# Patient Record
Sex: Male | Born: 1935
Health system: Southern US, Community
[De-identification: ages and names within clinical notes are randomized; demographics above are authoritative.]

## PROBLEM LIST (undated history)

## (undated) DIAGNOSIS — I10 Essential (primary) hypertension: Secondary | ICD-10-CM

## (undated) DIAGNOSIS — I351 Nonrheumatic aortic (valve) insufficiency: Secondary | ICD-10-CM

## (undated) DIAGNOSIS — E785 Hyperlipidemia, unspecified: Secondary | ICD-10-CM

## (undated) DIAGNOSIS — H3320 Serous retinal detachment, unspecified eye: Secondary | ICD-10-CM

## (undated) DIAGNOSIS — C189 Malignant neoplasm of colon, unspecified: Secondary | ICD-10-CM

## (undated) DIAGNOSIS — I219 Acute myocardial infarction, unspecified: Secondary | ICD-10-CM

## (undated) DIAGNOSIS — I517 Cardiomegaly: Secondary | ICD-10-CM

## (undated) HISTORY — PX: EYE SURGERY: SHX253

## (undated) HISTORY — DX: Hyperlipidemia, unspecified: E78.5

## (undated) HISTORY — DX: Acute myocardial infarction, unspecified: I21.9

## (undated) HISTORY — DX: Serous retinal detachment, unspecified eye: H33.20

## (undated) HISTORY — PX: PARTIAL COLECTOMY: SHX5273

## (undated) HISTORY — DX: Malignant neoplasm of colon, unspecified: C18.9

## (undated) HISTORY — DX: Essential (primary) hypertension: I10

---

## 1978-01-16 DIAGNOSIS — I219 Acute myocardial infarction, unspecified: Secondary | ICD-10-CM

## 1978-01-16 HISTORY — DX: Acute myocardial infarction, unspecified: I21.9

## 1988-09-16 DIAGNOSIS — C189 Malignant neoplasm of colon, unspecified: Secondary | ICD-10-CM

## 1988-09-16 HISTORY — DX: Malignant neoplasm of colon, unspecified: C18.9

## 2005-03-21 ENCOUNTER — Ambulatory Visit (HOSPITAL_COMMUNITY): Admission: RE | Admit: 2005-03-21 | Discharge: 2005-03-22 | Payer: Self-pay | Admitting: Ophthalmology

## 2008-10-06 ENCOUNTER — Ambulatory Visit (HOSPITAL_COMMUNITY): Admission: RE | Admit: 2008-10-06 | Discharge: 2008-10-07 | Payer: Self-pay | Admitting: Ophthalmology

## 2008-11-17 ENCOUNTER — Ambulatory Visit (HOSPITAL_COMMUNITY): Admission: RE | Admit: 2008-11-17 | Discharge: 2008-11-18 | Payer: Self-pay | Admitting: Ophthalmology

## 2009-11-18 ENCOUNTER — Observation Stay (HOSPITAL_COMMUNITY): Admission: RE | Admit: 2009-11-18 | Discharge: 2009-11-18 | Payer: Self-pay | Admitting: Ophthalmology

## 2010-02-10 ENCOUNTER — Ambulatory Visit
Admission: RE | Admit: 2010-02-10 | Discharge: 2010-02-10 | Payer: Self-pay | Source: Home / Self Care | Attending: Family Medicine | Admitting: Family Medicine

## 2010-02-10 ENCOUNTER — Encounter: Payer: Self-pay | Admitting: Family Medicine

## 2010-02-10 DIAGNOSIS — C189 Malignant neoplasm of colon, unspecified: Secondary | ICD-10-CM | POA: Insufficient documentation

## 2010-02-10 DIAGNOSIS — D126 Benign neoplasm of colon, unspecified: Secondary | ICD-10-CM | POA: Insufficient documentation

## 2010-02-10 DIAGNOSIS — R3919 Other difficulties with micturition: Secondary | ICD-10-CM | POA: Insufficient documentation

## 2010-02-10 DIAGNOSIS — I10 Essential (primary) hypertension: Secondary | ICD-10-CM | POA: Insufficient documentation

## 2010-02-10 LAB — CONVERTED CEMR LAB
Bilirubin Urine: NEGATIVE
Blood in Urine, dipstick: NEGATIVE
HCT: 45.2 % (ref 39.0–52.0)
Hemoglobin: 15.2 g/dL (ref 13.0–17.0)
Ketones, urine, test strip: NEGATIVE
Lymphs Abs: 1.7 10*3/uL (ref 0.7–4.0)
MCV: 88.6 fL (ref 78.0–100.0)
Monocytes Relative: 5 % (ref 3–12)
Neutrophils Relative %: 62 % (ref 43–77)
Protein, U semiquant: NEGATIVE
RBC: 5.1 M/uL (ref 4.22–5.81)
Urobilinogen, UA: 0.2
WBC: 5.2 10*3/uL (ref 4.0–10.5)

## 2010-02-11 DIAGNOSIS — E785 Hyperlipidemia, unspecified: Secondary | ICD-10-CM | POA: Insufficient documentation

## 2010-02-11 LAB — CONVERTED CEMR LAB
AST: 20 units/L (ref 0–37)
Albumin: 4.2 g/dL (ref 3.5–5.2)
Alkaline Phosphatase: 63 units/L (ref 39–117)
BUN: 14 mg/dL (ref 6–23)
HDL: 78 mg/dL (ref >39)
LDL Cholesterol: 167 mg/dL — ABNORMAL HIGH (ref 0–99)
PSA: 0.55 ng/mL (ref <=4.00)
Potassium: 4.3 meq/L (ref 3.5–5.3)
Total CHOL/HDL Ratio: 3.4

## 2010-02-17 NOTE — Assessment & Plan Note (Signed)
Summary: NOV prostate check   Vital Signs:  Patient profile:   77 year old male Height:      66 inches Weight:      134 pounds BMI:     21.71 O2 Sat:      99 % on Room air Pulse rate:   64 / minute BP sitting:   142 / 76  (left arm) Cuff size:   regular  Vitals Entered By: Payton Spark CMA (February 10, 2010 9:14 AM)  O2 Flow:  Room air CC: New to est.    Primary Care Provider:  Seymour Bars DO  CC:  New to est. .  History of Present Illness: 77 yo [place] male presents for NOV.  He has a distant hx of colon cancer 10-15 yrs ago per his wife's report.  he received a partial colectomy and chemo and had a colonsocpy last done 4 yrs ago in [place] that was normal.  He is likely due for f/u next yr.  It has been a while since he's had his labs checked and has been concerned about prostate cancer b/c recently a friend of his died from this.  He denies any change in his urine other than 'dark color'.  He reports having an AMI 28 yrs ago but never recieved any intervention, never had further problems and has not been taking ANY medications.  Denies problems with CP or DOE.  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 Speaks limited [place], completed elementary school. Has 2 kids. Lives with wife, son and daughter in law. Never smoked. Rare ETOH. Walks for exercise, good diet.  Review of Systems       no fevers/sweats/weakness, unexplained wt loss/gain, no change in vision, no difficulty hearing, ringing in ears, no hay fever/allergies, no  CP/discomfort, no palpitations, no breast lump/nipple discharge, no cough/wheeze, no blood in stool,no  N/V/D, no nocturia, no leaking urine, no unusual vag bleeding, no vaginal/penile discharge, no muscle/joint pain, no rash, no new/changing mole, no HA, no memory loss, no anxiety, no sleep problem, no depression, no unexplained lumps, no easy bruising/bleeding, no concern with sexual function   Physical Exam  General:  alert, well-developed, well-nourished, and well-hydrated.  here with wife (interpreting some of our visit) Head:  normocephalic and atraumatic.   Eyes:  sclera non icteric Ears:  partial cerumen obstruction R side Nose:  no nasal discharge.   Mouth:  pharynx pink and moist.   Neck:  no masses.   Lungs:  normal respiratory effort, no intercostal retractions, no accessory muscle use, normal breath sounds, and no wheezes.   Heart:  normal rate, regular rhythm, and no murmur.   Abdomen:  soft, non-tender, normal bowel sounds, no distention, no masses, no guarding, no hepatomegaly, and no splenomegaly.   Rectal:  hemoccult neg Prostate:  no nodules, no asymmetry, no induration, and 1+ enlarged.   Pulses:  2+ radial pulses Extremities:  no LE edema Skin:  color normal.  1.2 cm dark nevus over R gluteus Cervical Nodes:  No lymphadenopathy noted Psych:  memory intact for recent and  remote, good eye contact, not anxious appearing, and not depressed appearing.     Impression & Recommendations:  Problem # 1:  COLON CANCER (ICD-153.9) Hemoccult neg.  Will get old records.  Likley due to repeat colonoscopy in 1 yr. DRE normal, only mild hypertrophy with a normal UA today. Due for complete screening labs today.  Orders: T-CBC w/Diff ([id number])  Problem # 2:  ESSENTIAL HYPERTENSION, BENIGN (ICD-401.1) Given hx of AMI years ago, will go ahead and treat his HTN with Coreg.  Start ASA 81 mg/ day and update labs.  RTC for a CPE in 6 wks. His updated medication list for this  problem includes:    Carvedilol 6.25 Mg Tabs (Carvedilol) .[name]... 1 tab by mouth two times a day  BP today: 142/76  Complete Medication List: 1)  Aspirin 81 Mg Tbec (Aspirin) .[name].. 1 tab by mouth daily 2)  Carvedilol 6.25 Mg Tabs (Carvedilol) .[name].. 1 tab by mouth two times a day  Other Orders: T-PSA Total (Medicare Screen Only) ([id number]) T-Comprehensive Metabolic Panel ([phone]) T-Lipid Profile (10272-53664) UA Dipstick w/o Micro (automated)  (81003) DRE (Q0347)  Patient Instructions: 1)  UA today. 2)  Prostate exam normal. 3)  Update fasting labs downstairs. 4)  Will call you w/ results tomorrow. 5)  For elevated BP with hx of heart attack:  Start Carvedilol 1 tab in the morning and 1 tab at night everyday.  6)  Add a baby aspirin once daily for heart protection. 7)  Schedule a PHYSICAL in 6 wks. Prescriptions: CARVEDILOL 6.25 MG TABS (CARVEDILOL) 1 tab by mouth two times a day  #60 x 2   Entered and Authorized by:   Seymour Bars DO   Signed by:   Seymour Bars DO on 02/10/2010   Method used:   Electronically to        [facility] [phone]* (retail)       [address] [place], [place]  [address]       Ph: [phone]       Fax: [phone]   RxID:   [id number]    Orders Added: 1)  T-PSA Total (Medicare Screen Only) [id number] 2)  T-CBC w/Diff [28315-17616] 3)  T-Comprehensive Metabolic Panel [80053-22900] 4)  T-Lipid Profile [80061-22930] 5)  UA Dipstick w/o Micro (automated)  [81003] 6)  New Patient Level III [99203] 7)  DRE [G0102]    Laboratory Results   Urine Tests    Routine Urinalysis   Color: yellow Appearance: Clear Glucose: negative   (Normal Range: Negative) Bilirubin: negative   (Normal Range: Negative) Ketone: negative   (Normal Range: Negative) Spec. Gravity: 1.020   (Normal Range: 1.003-1.035) Blood: negative   (Normal Range: Negative) pH: 5.0   (Normal Range: 5.0-8.0) Protein: negative   (Normal Range:  Negative) Urobilinogen: 0.2   (Normal Range: 0-1) Nitrite: negative   (Normal Range: Negative) Leukocyte Esterace: negative   (Normal Range: Negative)

## 2010-03-17 ENCOUNTER — Encounter (INDEPENDENT_AMBULATORY_CARE_PROVIDER_SITE_OTHER): Payer: Medicare Other | Admitting: Family Medicine

## 2010-03-17 ENCOUNTER — Encounter: Payer: Self-pay | Admitting: Family Medicine

## 2010-03-17 DIAGNOSIS — Z23 Encounter for immunization: Secondary | ICD-10-CM

## 2010-03-17 DIAGNOSIS — Z0000 Encounter for general adult medical examination without abnormal findings: Secondary | ICD-10-CM

## 2010-03-24 NOTE — Assessment & Plan Note (Signed)
Summary: CPE   Vital Signs:  Patient profile:   77 year old male Height:      66 inches Weight:      137 pounds BMI:     22.19 O2 Sat:      100 % on Room air Pulse rate:   58 / minute BP sitting:   142 / 76  (left arm) Cuff size:   regular  Vitals Entered By: Payton Spark CMA (March 17, 2010 9:49 AM)  O2 Flow:  Room air CC: CPE    Primary Care Provider:  Seymour Bars DO  CC:  CPE .  History of Present Illness: 77 yo WM presnts for CPE.  He is a fairly healthy [place] male with a hx of colon cancer.  His wife says that his last colonoscopy was about 4 yrs ago.  He is doing well on Carvediolol for HTN and a distant hx of AMI.  He has had some increaesd constipation over hte past 2 wks but denies change in diet or activity level.  Denies blood in his stool, nausea or wt loss.  Denies CP or DOE or palpitations.  REquests to continue prostate cancer screening.  Recently had labs done in Jan and cholesterol was high but he has not yet pick up RX for Simvastatin.  He does also c/o ED.  He is not on nitroglycerin or an alpha  blocker.    Current Medications (verified): 1)  Aspirin 81 Mg Tbec (Aspirin) .[name].. 1 Tab By Mouth Daily 2)  Carvedilol 6.25 Mg Tabs (Carvedilol) .[name].. 1 Tab By Mouth Two Times A Day 3)  Simvastatin 40 Mg Tabs (Simvastatin) .[name].. 1 Tab By Mouth Qhs  Allergies (verified): No Known Drug Allergies  Past History:  Past Medical History: Reviewed history from 02/10/2010 and no changes required. hx colon cancer  ~ 1990s AMI 1980s detached retina- Dr Ashley Royalty  Past Surgical History: Reviewed history from 02/10/2010 and no changes required. partial colectomy eye surgery for detached retina  Family History: Reviewed history from 02/10/2010 and no changes required. father died liver CA mother died 'old age' 2 sisters - 1 died from Alzheimers 2 brothers died - colon cancer/ esophageal cancer  Social History: Reviewed history from 02/10/2010 and no changes  required. 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 Speaks limited [place], completed elementary school. Has 2 kids. Lives with wife, son and daughter in law. Never smoked. Rare ETOH. Walks for exercise, good diet.  Review of Systems  The patient denies anorexia, fever, weight loss, weight gain, vision loss, decreased hearing, hoarseness, chest pain, syncope, dyspnea on exertion, peripheral edema, prolonged cough, headaches, hemoptysis, abdominal pain, melena, hematochezia, severe indigestion/heartburn, hematuria, incontinence, genital sores, muscle weakness, suspicious skin lesions, transient blindness, difficulty walking, depression, unusual weight change, abnormal bleeding, enlarged lymph nodes, angioedema, breast masses, and testicular masses.    Physical Exam  General:  alert, well-developed, well-nourished, and well-hydrated.  here with wife to help interpret Head:  normocephalic and atraumatic.   Eyes:  pupils equal, pupils round, and pupils reactive to light.  arcus seniilis bilat Ears:  EACs patent; TMs translucent and gray with good cone of light and bony landmarks.  Nose:  no nasal discharge.   Mouth:  pharynx pink and moist.   Neck:  no masses.  no audible carotid bruits Lungs:  Normal respiratory effort, chest expands symmetrically. Lungs are clear to auscultation, no crackles or wheezes. Heart:  Normal rate and regular rhythm. S1 and S2 normal without gallop, murmur,  click, rub or other extra sounds. Abdomen:  soft, non-tender, normal bowel sounds, no distention, no guarding, no inguinal hernia, no hepatomegaly, and no splenomegaly.  no AA bruits audible.  LIpoma palpable over the LLQ - nontender. Rectal:  hemoccult neg Prostate:  no nodules and 2+ enlarged.   Msk:  no joint tenderness, no joint swelling, and no joint warmth.   Pulses:  2+ raidal and pedal pulses Extremities:  no LE edema Skin:  color normal.   Cervical Nodes:  No lymphadenopathy noted Psych:  good eye  contact, not anxious appearing, and not depressed appearing.     Impression & Recommendations:  Problem # 1:  Preventive Health Care (ICD-V70.0) Keeping healthy checklist for men reviewed. BP elevated today -- did not take his Carvedilol this AM. Reviewed labs from Jan 2012 and reminded pt to START his Simvastatin for his cholesterol. DRE done, PSA done with labs (plan to stop at 75). Will work on getting him back in with GI for new onset constipation and hx of colon CA -- not sure of last colonoscopy. Tdap and Pneumovax updated today -- per wife had not had either. Healthy diet, regular exercise and MVI /day recommended.  Complete Medication List: 1)  Aspirin 81 Mg Tbec (Aspirin) .[name].. 1 tab by mouth daily 2)  Carvedilol 6.25 Mg Tabs (Carvedilol) .[name].. 1 tab by mouth two times a day 3)  Simvastatin 40 Mg Tabs (Simvastatin) .[name].. 1 tab by mouth qhs  Other Orders: Tdap => 32yrs IM (54098) Admin 1st Vaccine (11914) Pneumococcal Vaccine (78295) Admin of Any Addtl Vaccine (62130)  Patient Instructions: 1)  Continue Carvedilol 2 x a day for high BP. 2)  Start Simvastatin 40 mg at bedtime for high cholesterol. 3)  Add Colace 1-2 x a day (stool softener) for constipation. 4)  Will get you an appt for f/u with GI in [place] 5)  Return for f/u in 3 mos. Prescriptions: SIMVASTATIN 40 MG TABS (SIMVASTATIN) 1 tab by mouth qhs  #30 x 2   Entered and Authorized by:   Seymour Bars DO   Signed by:   Seymour Bars DO on 03/17/2010   Method used:   Electronically to        [facility] [phone]* (retail)       [address] [place], [place]  [address]       Ph: [phone]       Fax: [phone]   RxID:   [id number]    Orders Added: 1)  Tdap => 48yrs IM [90715] 2)  Admin 1st Vaccine [90471] 3)  Pneumococcal Vaccine [56433] 4)  Admin of Any Addtl Vaccine [90472] 5)  Est. Patient age 50&> [29518]   Immunizations Administered:  Tetanus Vaccine:    Vaccine Type: Tdap    Site: left  deltoid    Dose: 0.5 ml    Route: IM    Given by: Payton Spark CMA    Exp. Date: 11/05/2011    Lot #: [id number]    VIS given: 12/04/07 version given March 17, 2010.  Pneumonia Vaccine:    Vaccine Type: Pneumovax (Medicare)    Site: left deltoid    Dose: 0.5 ml    Route: IM    Given by: Payton Spark CMA    Exp. Date: 05/13/2011    Lot #: [id number]    VIS given: 12/21/08 version given March 17, 2010.   Immunizations Administered:  Tetanus Vaccine:  Vaccine Type: Tdap    Site: left deltoid    Dose: 0.5 ml    Route: IM    Given by: Payton Spark CMA    Exp. Date: 11/05/2011    Lot #: [id number]    VIS given: 12/04/07 version given March 17, 2010.  Pneumonia Vaccine:    Vaccine Type: Pneumovax (Medicare)    Site: left deltoid    Dose: 0.5 ml    Route: IM    Given by: Payton Spark CMA    Exp. Date: 05/13/2011    Lot #: [id number]    VIS given: 12/21/08 version given March 17, 2010.

## 2010-03-29 LAB — SURGICAL PCR SCREEN: Staphylococcus aureus: NEGATIVE

## 2010-03-29 LAB — CBC
Platelets: 203 10*3/uL (ref 150–400)
RDW: 12.8 % (ref 11.5–15.5)
WBC: 4.3 10*3/uL (ref 4.0–10.5)

## 2010-03-29 LAB — BASIC METABOLIC PANEL
BUN: 9 mg/dL (ref 6–23)
Calcium: 9.3 mg/dL (ref 8.4–10.5)
Creatinine, Ser: 1.05 mg/dL (ref 0.4–1.5)
GFR calc non Af Amer: >60 mL/min (ref >60)

## 2010-04-20 LAB — BASIC METABOLIC PANEL
BUN: 9 mg/dL (ref 6–23)
CO2: 31 mEq/L (ref 19–32)
Chloride: 103 mEq/L (ref 96–112)
GFR calc non Af Amer: >60 mL/min (ref >60)
Glucose, Bld: 96 mg/dL (ref 70–99)
Potassium: 4.4 mEq/L (ref 3.5–5.1)
Sodium: 138 mEq/L (ref 135–145)

## 2010-04-20 LAB — CBC
HCT: 44 % (ref 39.0–52.0)
Hemoglobin: 15.1 g/dL (ref 13.0–17.0)
MCV: 89.8 fL (ref 78.0–100.0)
RDW: 12.7 % (ref 11.5–15.5)

## 2010-04-22 LAB — BASIC METABOLIC PANEL
BUN: 15 mg/dL (ref 6–23)
Creatinine, Ser: 0.91 mg/dL (ref 0.4–1.5)
GFR calc non Af Amer: >60 mL/min (ref >60)
Glucose, Bld: 88 mg/dL (ref 70–99)
Potassium: 4.4 mEq/L (ref 3.5–5.1)

## 2010-04-22 LAB — CBC
HCT: 46.3 % (ref 39.0–52.0)
MCV: 90.8 fL (ref 78.0–100.0)
Platelets: 239 10*3/uL (ref 150–400)
RDW: 12.7 % (ref 11.5–15.5)

## 2010-04-22 LAB — AUTOLOGOUS SERUM PATCH PREP

## 2010-04-28 ENCOUNTER — Encounter: Payer: Self-pay | Admitting: Family Medicine

## 2010-05-10 ENCOUNTER — Encounter: Payer: Self-pay | Admitting: Family Medicine

## 2010-06-03 NOTE — Op Note (Signed)
Gregory Cuevas, DELAUGHTER                     ACCOUNT NO.:  [id number]   MEDICAL RECORD NO.:  [id number]          PATIENT TYPE:  AMB   LOCATION:  SDS                          FACILITY:  [facility]   PHYSICIAN:  John D. Ashley Royalty, M.D. DATE OF BIRTH:  [birth year]   DATE OF PROCEDURE:  03/21/2005  DATE OF DISCHARGE:                                 OPERATIVE REPORT   ADMISSION DIAGNOSIS:  Macular hole, left eye.   PROCEDURES:  1.  Pars plana vitrectomy, left eye.  2.  Retinal photocoagulation, left eye.  3.  Membrane peel, left eye.  4.  Serum patch, left eye.  5.  Gas-fluid exchange, left eye.  6.  Perfluoropropane injection, left eye.   SURGEON:  Beulah Gandy. Ashley Royalty, M.D.   ASSISTANT:  Rosalie Doctor, MA   ANESTHESIA:  General.   DETAILS:  Usual prep and drape, peritomies at 10, 2 and 4 o'clock.  The 5-mm  infusion port anchored into place at 4 o'clock.  Contact lens ring anchored  into place at 6 and 12 o'clock.  Provisc placed on the corneal surface and  the flat contact lens was placed.  Pars plana vitrectomy was begun just  behind the pseudophakos.  Blood and vitreous were encountered well as  vitreous pigment.  This material was removed under low suction and rapid  cutting down to the macular surface.  The vitrectomy was carried out into  the periphery and loose vitreous was carefully removed.  The silicone-tipped  suction line was drawn down to the macular surface until the fish strike  sign occurred.  Posterior membrane peeling was performed at this time with  the lighted pick and the suction line.  The 30-degree prismatic lens was  moved into place and the vitrectomy was carried down to the vitreous base  for 360 degrees.  Once this was accomplished, the magnifying contact lens  was placed and attention was carried to the macular hole region.  The  diamond-dusted membrane scraper was used to engage the internal limiting  membrane and peel it per 360 degrees around the hole.  The entire  macular  region was peeled.  The membrane remnants were removed from the eye.  A gas-  fluid exchange was then carried out.  Before the gas-fluid exchange, the  indirect ophthalmoscope laser was moved into place, 508 burns were placed  around the retinal periphery with a power of 400 milliwatts, 1000 microns  each and 0.07 second each.  An air-fluid exchange was carried out.  Sufficient time was allowed for additional fluid to track down the walls of  the eye and collect in the posterior segment.  A serum patch and Perfluoron  propane 17% were prepared during this time.  Additional fluid was removed  with the [employer] [facility] brush  and serum patch was delivered.  The  intravitreal gas was exchanged for C3F8 17%.  The instruments were removed  from the eye and 9-0 nylon was used to close the sclerotomy sites.  They  were tested and found to be tight.  Conjunctiva was closed  with wet-field  cautery.  The closing tension was 10 with a Barraquer tonometer.  Polymyxin  and gentamicin were  irrigated into Tenon's space, Marcaine was injected around the globe for  postop pain and Decadron 10 mg was injected to the lower subconjunctival  space.  TobraDex ophthalmic ointment, a patch and shield were placed.  The  patient was awakened and taken to recovery in satisfactory condition.      Beulah Gandy. Ashley Royalty, M.D.  Electronically Signed     JDM/MEDQ  D:  03/21/2005  T:  03/22/2005  Job:  [id number]

## 2010-06-21 ENCOUNTER — Ambulatory Visit: Payer: Medicare Other | Admitting: Family Medicine

## 2010-08-23 ENCOUNTER — Encounter (INDEPENDENT_AMBULATORY_CARE_PROVIDER_SITE_OTHER): Payer: Medicare Other | Admitting: Ophthalmology

## 2011-04-25 ENCOUNTER — Ambulatory Visit (INDEPENDENT_AMBULATORY_CARE_PROVIDER_SITE_OTHER): Payer: Medicare Other | Admitting: Family Medicine

## 2011-04-25 ENCOUNTER — Encounter: Payer: Self-pay | Admitting: Family Medicine

## 2011-04-25 VITALS — BP 161/80 | HR 61 | Ht 66.0 in | Wt 137.0 lb

## 2011-04-25 DIAGNOSIS — M791 Myalgia, unspecified site: Secondary | ICD-10-CM

## 2011-04-25 DIAGNOSIS — IMO0001 Reserved for inherently not codable concepts without codable children: Secondary | ICD-10-CM

## 2011-04-25 DIAGNOSIS — Z1331 Encounter for screening for depression: Secondary | ICD-10-CM

## 2011-04-25 DIAGNOSIS — Z9181 History of falling: Secondary | ICD-10-CM

## 2011-04-25 DIAGNOSIS — I10 Essential (primary) hypertension: Secondary | ICD-10-CM

## 2011-04-25 LAB — CBC
HCT: 45.1 % (ref 39.0–52.0)
Hemoglobin: 14.4 g/dL (ref 13.0–17.0)
MCH: 29.1 pg (ref 26.0–34.0)
MCV: 91.1 fL (ref 78.0–100.0)
RBC: 4.95 MIL/uL (ref 4.22–5.81)

## 2011-04-25 LAB — LIPID PANEL
Cholesterol: 263 mg/dL — ABNORMAL HIGH (ref 0–200)
HDL: 77 mg/dL (ref >39)
Total CHOL/HDL Ratio: 3.4 Ratio

## 2011-04-25 MED ORDER — METOPROLOL TARTRATE 50 MG PO TABS: 50.0000 mg | ORAL_TABLET | Freq: Two times a day (BID) | ORAL | Status: DC

## 2011-04-25 MED ORDER — PRAVASTATIN SODIUM 40 MG PO TABS: 40.0000 mg | ORAL_TABLET | Freq: Every day | ORAL | Status: DC

## 2011-04-25 NOTE — Progress Notes (Addendum)
  Subjective:    Patient ID: Gregory Cuevas, male    DOB: January 21, 1933, 78 y.o.   MRN: [id number]  HPI  Feels a pinching pain in the lower arms and on the sides of hte knees. Mostly happens at night.  Says better with acitivyt. Worse at rest. No meds for pain.  Started a couple of months ago.  No weaknesss  BP - Says his BP is high in the AM but better in the afternoons. Hx of CAD in the 53s.    Review of Systems     Objective:   Physical Exam  Constitutional: He is oriented to person, place, and time. He appears well-developed and well-nourished.  HENT:  Head: Normocephalic and atraumatic.       Left TM is clear.  Right canal is blocked by cerumen.   Cardiovascular: Normal rate, regular rhythm and normal heart sounds.        No carotid or abdominal bruits.   Pulmonary/Chest: Effort normal and breath sounds normal.  Musculoskeletal:       Joints have normal range of motion. He is nontender over the muscles themselves.  Neurological: He is alert and oriented to person, place, and time.  Skin: Skin is warm and dry.  Psychiatric: He has a normal mood and affect. His behavior is normal.          Assessment & Plan:  Pinching pain-  Unclear etiology. I'm not sure if this is more like myalgias. We'll check a CK, CBC, and a TSH.  HTN - new diagnosis. I discussed that his blood pressure is high again has been high the last time he's been here. He feels that an afternoon. I did explain to him with his history of coronary artery disease but he really need to be on a beta blocker and statin in addition to his aspirin he does take daily currently. I like him to follow up in 6 weeks to recheck his blood pressure make sure he is on target with his medications. We will check a CMP and a lipid panel today.  Coronary artery disease-see above under hypertension. He is taking a daily aspirin currently.  History of colon cancer-his colonoscopy is up to date and he had one last year.  Depression screen-PHQ  9 score of 1. Negative for depression.  fall assessment-score of 2, low risk for falls.

## 2011-04-26 ENCOUNTER — Encounter: Payer: Self-pay | Admitting: *Deleted

## 2011-04-26 LAB — COMPLETE METABOLIC PANEL WITH GFR
AST: 18 U/L (ref 0–37)
Alkaline Phosphatase: 66 U/L (ref 39–117)
BUN: 23 mg/dL (ref 6–23)
GFR, Est Non African American: 84 mL/min
Glucose, Bld: 91 mg/dL (ref 70–99)
Sodium: 139 mEq/L (ref 135–145)
Total Bilirubin: 0.7 mg/dL (ref 0.3–1.2)

## 2011-04-27 ENCOUNTER — Encounter: Payer: Self-pay | Admitting: *Deleted

## 2011-06-02 ENCOUNTER — Encounter: Payer: Self-pay | Admitting: *Deleted

## 2011-06-06 ENCOUNTER — Encounter: Payer: Medicare Other | Admitting: Family Medicine

## 2011-08-02 IMAGING — CR DG CHEST 2V
2 series · 2 of 2 positions shown · non-contrast
Comparison: 03/21/2005

CLINICAL DATA: Preop

CHEST - 2 VIEW

[view not recorded (1 of 2)]
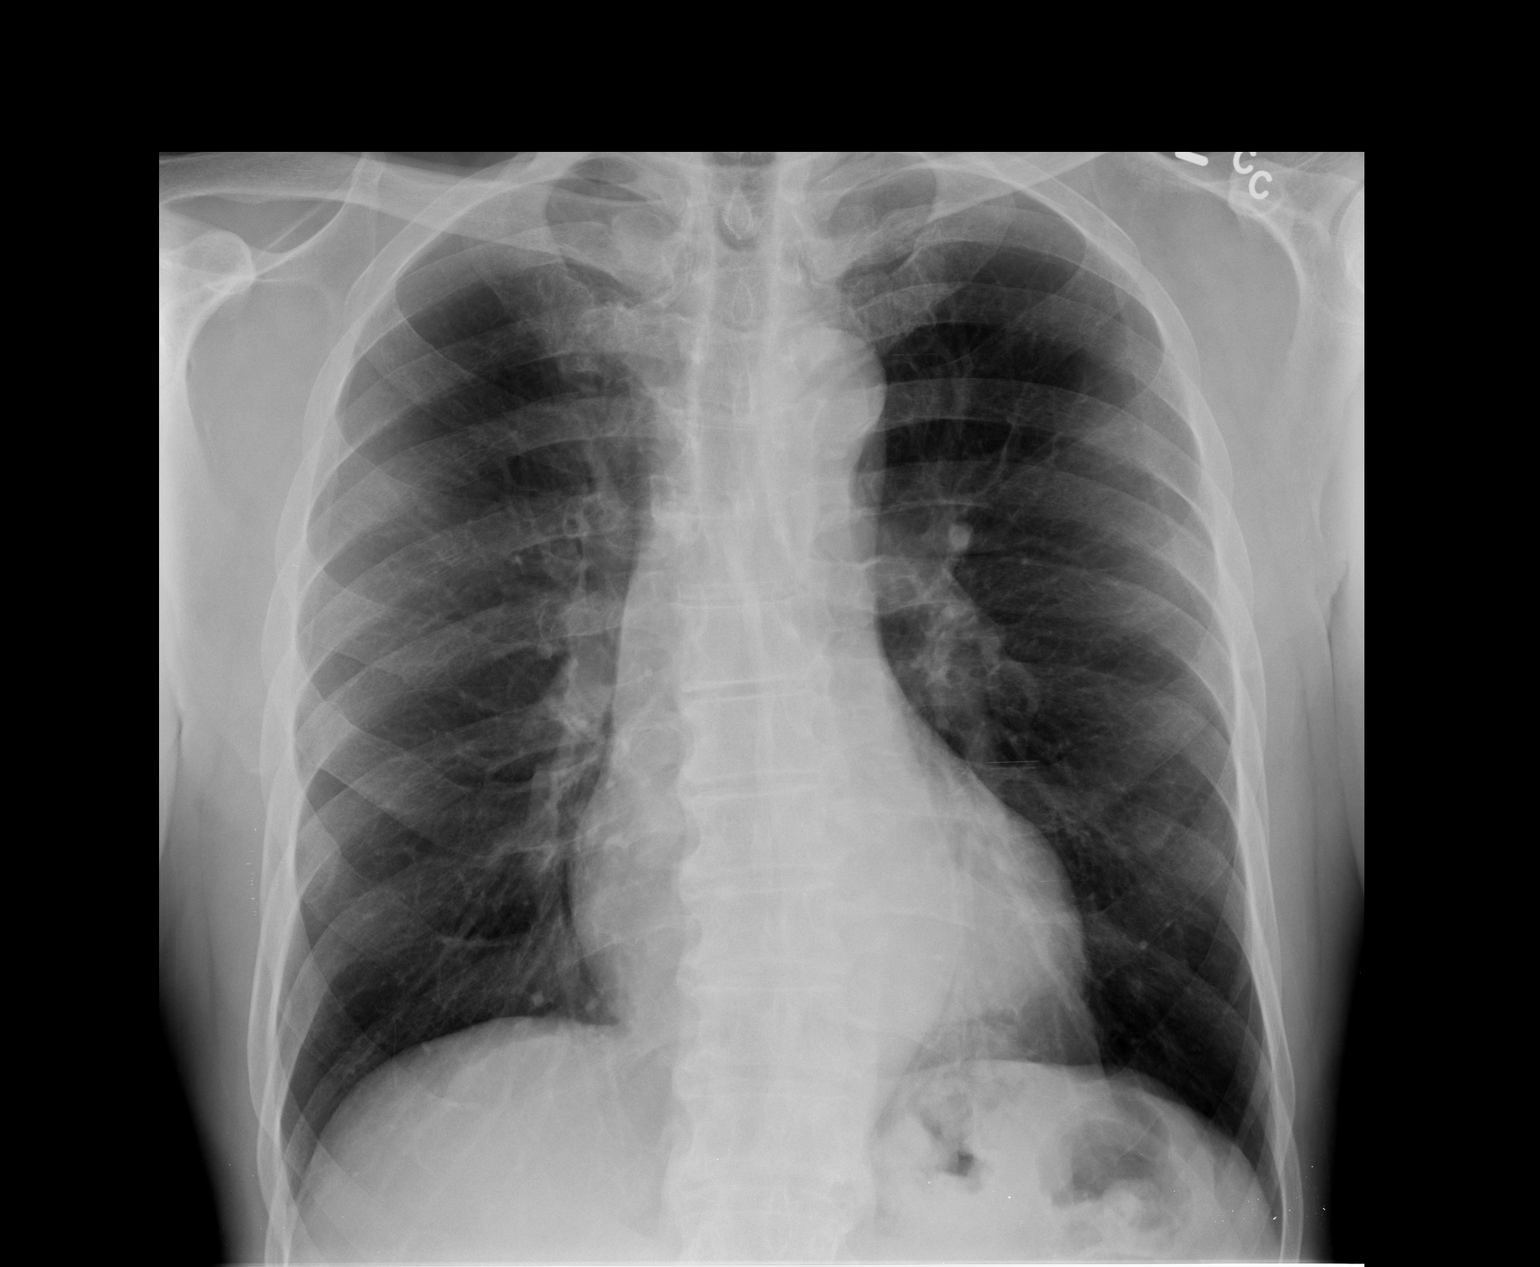

[view not recorded (2 of 2)]
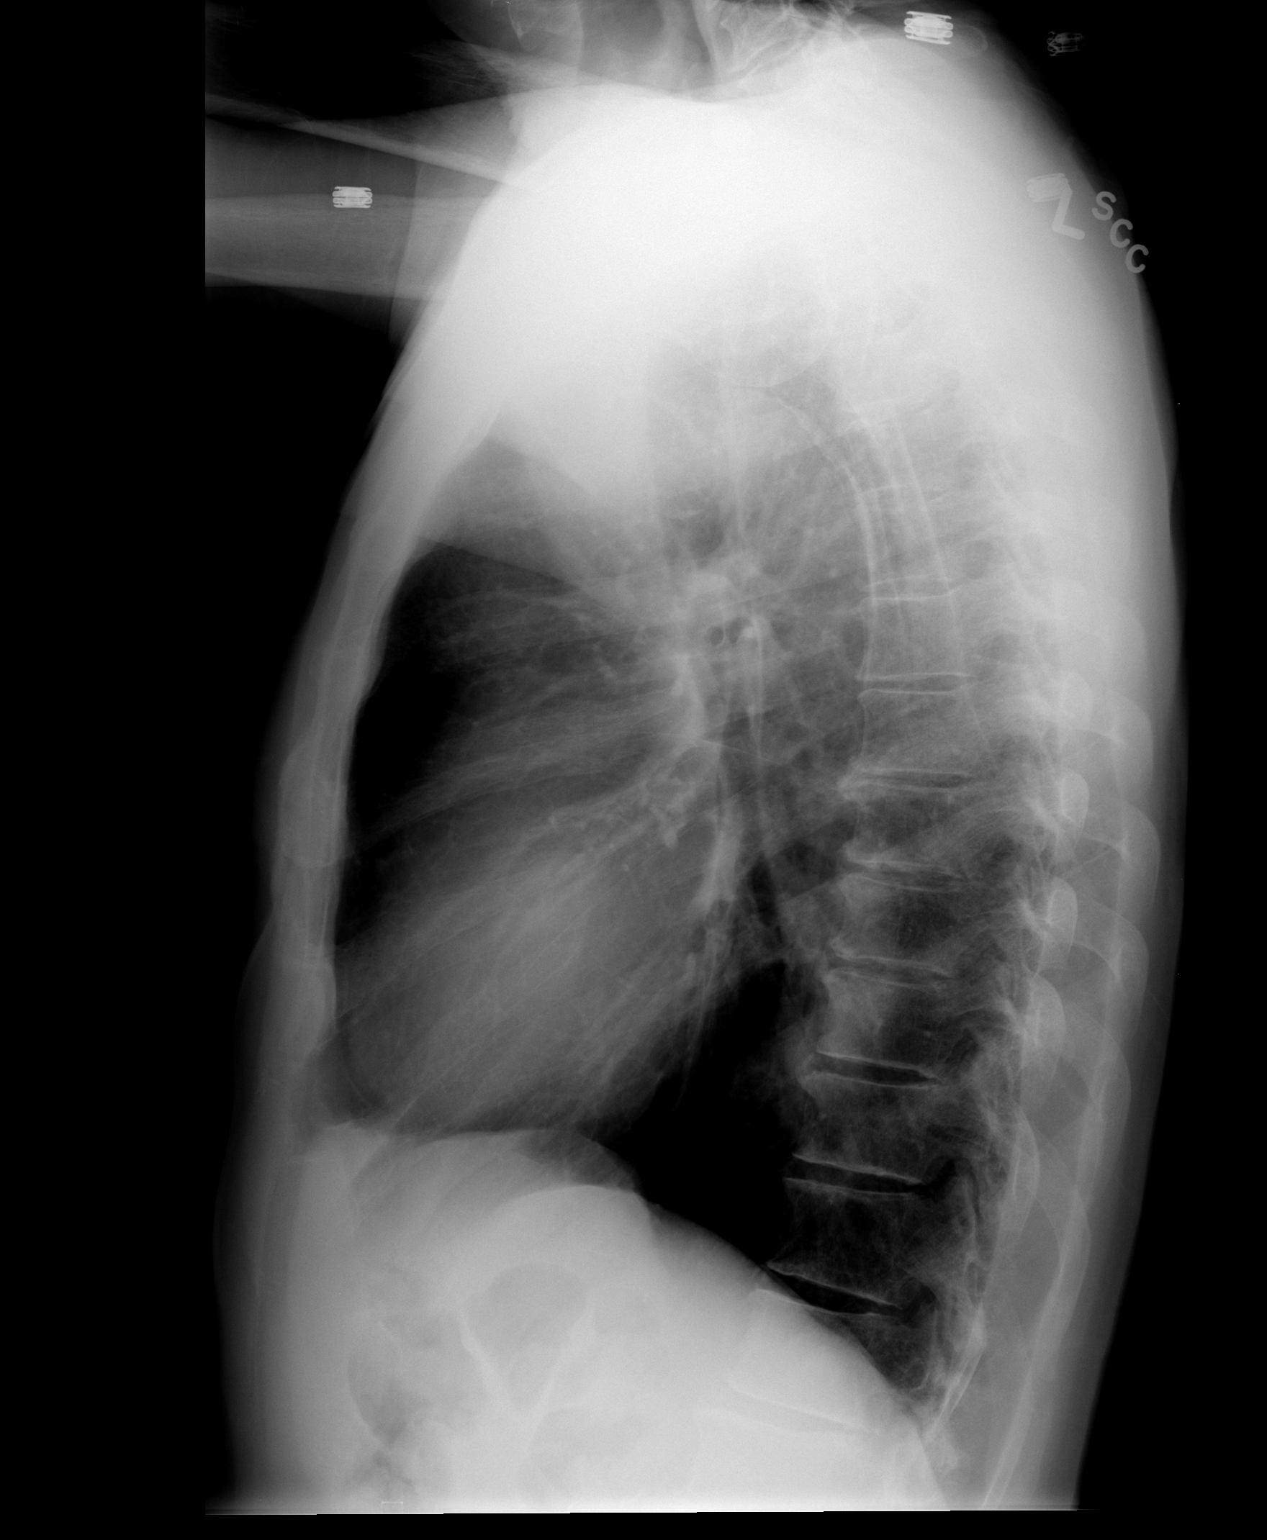

[2 of 2 positions shown; findings below may reference images not displayed]

FINDINGS: Cardiomediastinal silhouette is stable.  No acute
infiltrate or pleural effusion.  No pulmonary edema.  Mild
hyperinflation again noted.  Stable mild degenerative changes
thoracic spine.
IMPRESSION: Stable mild COPD.  No acute disease.

## 2012-09-17 ENCOUNTER — Other Ambulatory Visit: Payer: Self-pay | Admitting: Family Medicine

## 2012-10-09 ENCOUNTER — Telehealth: Payer: Self-pay | Admitting: Physician Assistant

## 2012-10-09 NOTE — Telephone Encounter (Signed)
Insurance sent recommendations of vaccines for shingles,tdap and flu. You have not been seen in a while. It would be great to come in and make an appt to go over any health issues and make sure screening test are up to date.

## 2012-11-08 ENCOUNTER — Telehealth: Payer: Self-pay | Admitting: Physician Assistant

## 2012-11-08 NOTE — Telephone Encounter (Signed)
Consider appt with Dr. Linford Arnold to discuss why not taking medications. Engineer, mining.

## 2012-11-14 NOTE — Telephone Encounter (Signed)
Mailbox full . Unable to leave message

## 2012-12-25 ENCOUNTER — Ambulatory Visit (INDEPENDENT_AMBULATORY_CARE_PROVIDER_SITE_OTHER): Payer: Medicare Other | Admitting: Physician Assistant

## 2012-12-25 ENCOUNTER — Encounter: Payer: Medicare Other | Admitting: Family Medicine

## 2012-12-25 ENCOUNTER — Encounter: Payer: Self-pay | Admitting: Physician Assistant

## 2012-12-25 VITALS — BP 161/64 | HR 60 | Wt 133.0 lb

## 2012-12-25 DIAGNOSIS — R109 Unspecified abdominal pain: Secondary | ICD-10-CM

## 2012-12-25 DIAGNOSIS — E785 Hyperlipidemia, unspecified: Secondary | ICD-10-CM

## 2012-12-25 DIAGNOSIS — S335XXA Sprain of ligaments of lumbar spine, initial encounter: Secondary | ICD-10-CM

## 2012-12-25 DIAGNOSIS — Z0000 Encounter for general adult medical examination without abnormal findings: Secondary | ICD-10-CM

## 2012-12-25 DIAGNOSIS — Z23 Encounter for immunization: Secondary | ICD-10-CM

## 2012-12-25 DIAGNOSIS — I10 Essential (primary) hypertension: Secondary | ICD-10-CM

## 2012-12-25 LAB — POCT URINALYSIS DIPSTICK
Bilirubin, UA: NEGATIVE
Ketones, UA: NEGATIVE
Leukocytes, UA: NEGATIVE
pH, UA: 7

## 2012-12-25 MED ORDER — METOPROLOL TARTRATE 50 MG PO TABS: 50.0000 mg | ORAL_TABLET | Freq: Two times a day (BID) | ORAL | Status: DC

## 2012-12-25 MED ORDER — PRAVASTATIN SODIUM 40 MG PO TABS: 40.0000 mg | ORAL_TABLET | Freq: Every day | ORAL | Status: DC

## 2012-12-25 MED ORDER — AMBULATORY NON FORMULARY MEDICATION: Status: DC

## 2012-12-25 NOTE — Patient Instructions (Addendum)
Muscle sprain- alternate Ice and Heat can also use ibuprofen up to 800mg  up to three times a day. I will give exercises that can do.   Low Back Sprain with Rehab  A sprain is an injury in which a ligament is torn. The ligaments of the lower back are vulnerable to sprains. However, they are strong and require great force to be injured. These ligaments are important for stabilizing the spinal column. Sprains are classified into three categories. Grade 1 sprains cause pain, but the tendon is not lengthened. Grade 2 sprains include a lengthened ligament, due to the ligament being stretched or partially ruptured. With grade 2 sprains there is still function, although the function may be decreased. Grade 3 sprains involve a complete tear of the tendon or muscle, and function is usually impaired. SYMPTOMS   Severe pain in the lower back.  Sometimes, a feeling of a "pop," "snap," or tear, at the time of injury.  Tenderness and sometimes swelling at the injury site.  Uncommonly, bruising (contusion) within 48 hours of injury.  Muscle spasms in the back. CAUSES  Low back sprains occur when a force is placed on the ligaments that is greater than they can handle. Common causes of injury include:  Performing a stressful act while off-balance.  Repetitive stressful activities that involve movement of the lower back.  Direct hit (trauma) to the lower back. RISK INCREASES WITH:  Contact sports (football, wrestling).  Collisions (major skiing accidents).  Sports that require throwing or lifting (baseball, weightlifting).  Sports involving twisting of the spine (gymnastics, diving, tennis, golf).  Poor strength and flexibility.  Inadequate protection.  Previous back injury or surgery (especially fusion). PREVENTION  Wear properly fitted and padded protective equipment.  Warm up and stretch properly before activity.  Allow for adequate recovery between workouts.  Maintain physical  fitness:  Strength, flexibility, and endurance.  Cardiovascular fitness.  Maintain a healthy body weight. PROGNOSIS  If treated properly, low back sprains usually heal with non-surgical treatment. The length of time for healing depends on the severity of the injury.  RELATED COMPLICATIONS   Recurring symptoms, resulting in a chronic problem.  Chronic inflammation and pain in the low back.  Delayed healing or resolution of symptoms, especially if activity is resumed too soon.  Prolonged impairment.  Unstable or arthritic joints of the low back. TREATMENT  Treatment first involves the use of ice and medicine, to reduce pain and inflammation. The use of strengthening and stretching exercises may help reduce pain with activity. These exercises may be performed at home or with a therapist. Severe injuries may require referral to a therapist for further evaluation and treatment, such as ultrasound. Your caregiver may advise that you wear a back brace or corset, to help reduce pain and discomfort. Often, prolonged bed rest results in greater harm then benefit. Corticosteroid injections may be recommended. However, these should be reserved for the most serious cases. It is important to avoid using your back when lifting objects. At night, sleep on your back on a firm mattress, with a pillow placed under your knees. If non-surgical treatment is unsuccessful, surgery may be needed.  MEDICATION   If pain medicine is needed, nonsteroidal anti-inflammatory medicines (aspirin and ibuprofen), or other minor pain relievers (acetaminophen), are often advised.  Do not take pain medicine for 7 days before surgery.  Prescription pain relievers may be given, if your caregiver thinks they are needed. Use only as directed and only as much as you need.  Ointments applied to the skin may be helpful.  Corticosteroid injections may be given by your caregiver. These injections should be reserved for the most  serious cases, because they may only be given a certain number of times. HEAT AND COLD  Cold treatment (icing) should be applied for 10 to 15 minutes every 2 to 3 hours for inflammation and pain, and immediately after activity that aggravates your symptoms. Use ice packs or an ice massage.  Heat treatment may be used before performing stretching and strengthening activities prescribed by your caregiver, physical therapist, or athletic trainer. Use a heat pack or a warm water soak. SEEK MEDICAL CARE IF:   Symptoms get worse or do not improve in 2 to 4 weeks, despite treatment.  You develop numbness or weakness in either leg.  You lose bowel or bladder function.  Any of the following occur after surgery: fever, increased pain, swelling, redness, drainage of fluids, or bleeding in the affected area.  New, unexplained symptoms develop. (Drugs used in treatment may produce side effects.) EXERCISES  RANGE OF MOTION (ROM) AND STRETCHING EXERCISES - Low Back Sprain Most people with lower back pain will find that their symptoms get worse with excessive bending forward (flexion) or arching at the lower back (extension). The exercises that will help resolve your symptoms will focus on the opposite motion.  Your physician, physical therapist or athletic trainer will help you determine which exercises will be most helpful to resolve your lower back pain. Do not complete any exercises without first consulting with your caregiver. Discontinue any exercises which make your symptoms worse, until you speak to your caregiver. If you have pain, numbness or tingling which travels down into your buttocks, leg or foot, the goal of the therapy is for these symptoms to move closer to your back and eventually resolve. Sometimes, these leg symptoms will get better, but your lower back pain may worsen. This is often an indication of progress in your rehabilitation. Be very alert to any changes in your symptoms and the  activities in which you participated in the 24 hours prior to the change. Sharing this information with your caregiver will allow him or her to most efficiently treat your condition. These exercises may help you when beginning to rehabilitate your injury. Your symptoms may resolve with or without further involvement from your physician, physical therapist or athletic trainer. While completing these exercises, remember:   Restoring tissue flexibility helps normal motion to return to the joints. This allows healthier, less painful movement and activity.  An effective stretch should be held for at least 30 seconds.  A stretch should never be painful. You should only feel a gentle lengthening or release in the stretched tissue. FLEXION RANGE OF MOTION AND STRETCHING EXERCISES: STRETCH  Flexion, Single Knee to Chest   Lie on a firm bed or floor with both legs extended in front of you.  Keeping one leg in contact with the floor, bring your opposite knee to your chest. Hold your leg in place by either grabbing behind your thigh or at your knee.  Pull until you feel a gentle stretch in your low back. Hold __________ seconds.  Slowly release your grasp and repeat the exercise with the opposite side. Repeat __________ times. Complete this exercise __________ times per day.  STRETCH  Flexion, Double Knee to Chest  Lie on a firm bed or floor with both legs extended in front of you.  Keeping one leg in contact with the floor, bring  your opposite knee to your chest.  Tense your stomach muscles to support your back and then lift your other knee to your chest. Hold your legs in place by either grabbing behind your thighs or at your knees.  Pull both knees toward your chest until you feel a gentle stretch in your low back. Hold __________ seconds.  Tense your stomach muscles and slowly return one leg at a time to the floor. Repeat __________ times. Complete this exercise __________ times per day.   STRETCH  Low Trunk Rotation  Lie on a firm bed or floor. Keeping your legs in front of you, bend your knees so they are both pointed toward the ceiling and your feet are flat on the floor.  Extend your arms out to the side. This will stabilize your upper body by keeping your shoulders in contact with the floor.  Gently and slowly drop both knees together to one side until you feel a gentle stretch in your low back. Hold for __________ seconds.  Tense your stomach muscles to support your lower back as you bring your knees back to the starting position. Repeat the exercise to the other side. Repeat __________ times. Complete this exercise __________ times per day  EXTENSION RANGE OF MOTION AND FLEXIBILITY EXERCISES: STRETCH  Extension, Prone on Elbows   Lie on your stomach on the floor, a bed will be too soft. Place your palms about shoulder width apart and at the height of your head.  Place your elbows under your shoulders. If this is too painful, stack pillows under your chest.  Allow your body to relax so that your hips drop lower and make contact more completely with the floor.  Hold this position for __________ seconds.  Slowly return to lying flat on the floor. Repeat __________ times. Complete this exercise __________ times per day.  RANGE OF MOTION  Extension, Prone Press Ups  Lie on your stomach on the floor, a bed will be too soft. Place your palms about shoulder width apart and at the height of your head.  Keeping your back as relaxed as possible, slowly straighten your elbows while keeping your hips on the floor. You may adjust the placement of your hands to maximize your comfort. As you gain motion, your hands will come more underneath your shoulders.  Hold this position __________ seconds.  Slowly return to lying flat on the floor. Repeat __________ times. Complete this exercise __________ times per day.  RANGE OF MOTION- Quadruped, Neutral Spine   Assume a hands and  knees position on a firm surface. Keep your hands under your shoulders and your knees under your hips. You may place padding under your knees for comfort.  Drop your head and point your tailbone toward the ground below you. This will round out your lower back like an angry cat. Hold this position for __________ seconds.  Slowly lift your head and release your tail bone so that your back sags into a large arch, like an old horse.  Hold this position for __________ seconds.  Repeat this until you feel limber in your low back.  Now, find your "sweet spot." This will be the most comfortable position somewhere between the two previous positions. This is your neutral spine. Once you have found this position, tense your stomach muscles to support your low back.  Hold this position for __________ seconds. Repeat __________ times. Complete this exercise __________ times per day.  STRENGTHENING EXERCISES - Low Back Sprain These exercises may  help you when beginning to rehabilitate your injury. These exercises should be done near your "sweet spot." This is the neutral, low-back arch, somewhere between fully rounded and fully arched, that is your least painful position. When performed in this safe range of motion, these exercises can be used for people who have either a flexion or extension based injury. These exercises may resolve your symptoms with or without further involvement from your physician, physical therapist or athletic trainer. While completing these exercises, remember:   Muscles can gain both the endurance and the strength needed for everyday activities through controlled exercises.  Complete these exercises as instructed by your physician, physical therapist or athletic trainer. Increase the resistance and repetitions only as guided.  You may experience muscle soreness or fatigue, but the pain or discomfort you are trying to eliminate should never worsen during these exercises. If this pain  does worsen, stop and make certain you are following the directions exactly. If the pain is still present after adjustments, discontinue the exercise until you can discuss the trouble with your caregiver. STRENGTHENING Deep Abdominals, Pelvic Tilt   Lie on a firm bed or floor. Keeping your legs in front of you, bend your knees so they are both pointed toward the ceiling and your feet are flat on the floor.  Tense your lower abdominal muscles to press your low back into the floor. This motion will rotate your pelvis so that your tail bone is scooping upwards rather than pointing at your feet or into the floor. With a gentle tension and even breathing, hold this position for __________ seconds. Repeat __________ times. Complete this exercise __________ times per day.  STRENGTHENING  Abdominals, Crunches   Lie on a firm bed or floor. Keeping your legs in front of you, bend your knees so they are both pointed toward the ceiling and your feet are flat on the floor. Cross your arms over your chest.  Slightly tip your chin down without bending your neck.  Tense your abdominals and slowly lift your trunk high enough to just clear your shoulder blades. Lifting higher can put excessive stress on the lower back and does not further strengthen your abdominal muscles.  Control your return to the starting position. Repeat __________ times. Complete this exercise __________ times per day.  STRENGTHENING  Quadruped, Opposite UE/LE Lift   Assume a hands and knees position on a firm surface. Keep your hands under your shoulders and your knees under your hips. You may place padding under your knees for comfort.  Find your neutral spine and gently tense your abdominal muscles so that you can maintain this position. Your shoulders and hips should form a rectangle that is parallel with the floor and is not twisted.  Keeping your trunk steady, lift your right hand no higher than your shoulder and then your left leg  no higher than your hip. Make sure you are not holding your breath. Hold this position for __________ seconds.  Continuing to keep your abdominal muscles tense and your back steady, slowly return to your starting position. Repeat with the opposite arm and leg. Repeat __________ times. Complete this exercise __________ times per day.  STRENGTHENING  Abdominals and Quadriceps, Straight Leg Raise   Lie on a firm bed or floor with both legs extended in front of you.  Keeping one leg in contact with the floor, bend the other knee so that your foot can rest flat on the floor.  Find your neutral spine, and tense  your abdominal muscles to maintain your spinal position throughout the exercise.  Slowly lift your straight leg off the floor about 6 inches for a count of 15, making sure to not hold your breath.  Still keeping your neutral spine, slowly lower your leg all the way to the floor. Repeat this exercise with each leg __________ times. Complete this exercise __________ times per day. POSTURE AND BODY MECHANICS CONSIDERATIONS - Low Back Sprain Keeping correct posture when sitting, standing or completing your activities will reduce the stress put on different body tissues, allowing injured tissues a chance to heal and limiting painful experiences. The following are general guidelines for improved posture. Your physician or physical therapist will provide you with any instructions specific to your needs. While reading these guidelines, remember:  The exercises prescribed by your provider will help you have the flexibility and strength to maintain correct postures.  The correct posture provides the best environment for your joints to work. All of your joints have less wear and tear when properly supported by a spine with good posture. This means you will experience a healthier, less painful body.  Correct posture must be practiced with all of your activities, especially prolonged sitting and standing.  Correct posture is as important when doing repetitive low-stress activities (typing) as it is when doing a single heavy-load activity (lifting). RESTING POSITIONS Consider which positions are most painful for you when choosing a resting position. If you have pain with flexion-based activities (sitting, bending, stooping, squatting), choose a position that allows you to rest in a less flexed posture. You would want to avoid curling into a fetal position on your side. If your pain worsens with extension-based activities (prolonged standing, working overhead), avoid resting in an extended position such as sleeping on your stomach. Most people will find more comfort when they rest with their spine in a more neutral position, neither too rounded nor too arched. Lying on a non-sagging bed on your side with a pillow between your knees, or on your back with a pillow under your knees will often provide some relief. Keep in mind, being in any one position for a prolonged period of time, no matter how correct your posture, can still lead to stiffness. PROPER SITTING POSTURE In order to minimize stress and discomfort on your spine, you must sit with correct posture. Sitting with good posture should be effortless for a healthy body. Returning to good posture is a gradual process. Many people can work toward this most comfortably by using various supports until they have the flexibility and strength to maintain this posture on their own. When sitting with proper posture, your ears will fall over your shoulders and your shoulders will fall over your hips. You should use the back of the chair to support your upper back. Your lower back will be in a neutral position, just slightly arched. You may place a small pillow or folded towel at the base of your lower back for  support.  When working at a desk, create an environment that supports good, upright posture. Without extra support, muscles tire, which leads to excessive  strain on joints and other tissues. Keep these recommendations in mind: CHAIR:  A chair should be able to slide under your desk when your back makes contact with the back of the chair. This allows you to work closely.  The chair's height should allow your eyes to be level with the upper part of your monitor and your hands to be slightly lower  than your elbows. BODY POSITION  Your feet should make contact with the floor. If this is not possible, use a foot rest.  Keep your ears over your shoulders. This will reduce stress on your neck and low back. INCORRECT SITTING POSTURES  If you are feeling tired and unable to assume a healthy sitting posture, do not slouch or slump. This puts excessive strain on your back tissues, causing more damage and pain. Healthier options include:  Using more support, like a lumbar pillow.  Switching tasks to something that requires you to be upright or walking.  Talking a brief walk.  Lying down to rest in a neutral-spine position. PROLONGED STANDING WHILE SLIGHTLY LEANING FORWARD  When completing a task that requires you to lean forward while standing in one place for a long time, place either foot up on a stationary 2-4 inch high object to help maintain the best posture. When both feet are on the ground, the lower back tends to lose its slight inward curve. If this curve flattens (or becomes too large), then the back and your other joints will experience too much stress, tire more quickly, and can cause pain. CORRECT STANDING POSTURES Proper standing posture should be assumed with all daily activities, even if they only take a few moments, like when brushing your teeth. As in sitting, your ears should fall over your shoulders and your shoulders should fall over your hips. You should keep a slight tension in your abdominal muscles to brace your spine. Your tailbone should point down to the ground, not behind your body, resulting in an over-extended swayback  posture.  INCORRECT STANDING POSTURES  Common incorrect standing postures include a forward head, locked knees and/or an excessive swayback. WALKING Walk with an upright posture. Your ears, shoulders and hips should all line-up. PROLONGED ACTIVITY IN A FLEXED POSITION When completing a task that requires you to bend forward at your waist or lean over a low surface, try to find a way to stabilize 3 out of 4 of your limbs. You can place a hand or elbow on your thigh or rest a knee on the surface you are reaching across. This will provide you more stability, so that your muscles do not tire as quickly. By keeping your knees relaxed, or slightly bent, you will also reduce stress across your lower back. CORRECT LIFTING TECHNIQUES DO :  Assume a wide stance. This will provide you more stability and the opportunity to get as close as possible to the object which you are lifting.  Tense your abdominals to brace your spine. Bend at the knees and hips. Keeping your back locked in a neutral-spine position, lift using your leg muscles. Lift with your legs, keeping your back straight.  Test the weight of unknown objects before attempting to lift them.  Try to keep your elbows locked down at your sides in order get the best strength from your shoulders when carrying an object.  Always ask for help when lifting heavy or awkward objects. INCORRECT LIFTING TECHNIQUES DO NOT:   Lock your knees when lifting, even if it is a small object.  Bend and twist. Pivot at your feet or move your feet when needing to change directions.  Assume that you can safely pick up even a paperclip without proper posture. Document Released: 01/02/2005 Document Revised: 03/27/2011 Document Reviewed: 04/16/2008 [facility] Patient Information 2014 [place], [place].   Keeping you healthy  Get these tests  Blood pressure- Have your blood pressure checked once a  year by your healthcare provider.  Normal blood pressure is  120/80  Weight- Have your body mass index (BMI) calculated to screen for obesity.  BMI is a measure of body fat based on height and weight. You can also calculate your own BMI at [email or web address].  Cholesterol- Have your cholesterol checked every year.  Diabetes- Have your blood sugar checked regularly if you have high blood pressure, high cholesterol, have a family history of diabetes or if you are overweight.  Screening for Colon Cancer- Colonoscopy starting at age 63.  Screening may begin sooner depending on your family history and other health conditions. Follow up colonoscopy as directed by your Gastroenterologist.  Screening for Prostate Cancer- Both blood work (PSA) and a rectal exam help screen for Prostate Cancer.  Screening begins at age 65 with African-American men and at age 31 with Caucasian men.  Screening may begin sooner depending on your family history.  Take these medicines  Aspirin- One aspirin daily can help prevent Heart disease and Stroke.  Flu shot- Every fall.  Tetanus- Every 10 years.  Zostavax- Once after the age of 76 to prevent Shingles.  Pneumonia shot- Once after the age of 103; if you are younger than 7, ask your healthcare provider if you need a Pneumonia shot.  Take these steps  Don't smoke- If you do smoke, talk to your doctor about quitting.  For tips on how to quit, go to www.smokefree.gov or call 1-800-QUIT-NOW.  Be physically active- Exercise 5 days a week for at least 30 minutes.  If you are not already physically active start slow and gradually work up to 30 minutes of moderate physical activity.  Examples of moderate activity include walking briskly, mowing the yard, dancing, swimming, bicycling, etc.  Eat a healthy diet- Eat a variety of healthy food such as fruits, vegetables, low fat milk, low fat cheese, yogurt, lean meant, poultry, fish, beans, tofu, etc. For more information go to www.thenutritionsource.org  Drink alcohol in  moderation- Limit alcohol intake to less than two drinks a day. Never drink and drive.  Dentist- Brush and floss twice daily; visit your dentist twice a year.  Depression- Your emotional health is as important as your physical health. If you're feeling down, or losing interest in things you would normally enjoy please talk to your healthcare provider.  Eye exam- Visit your eye doctor every year.  Safe sex- If you may be exposed to a sexually transmitted infection, use a condom.  Seat belts- Seat belts can save your life; always wear one.  Smoke/Carbon Monoxide detectors- These detectors need to be installed on the appropriate level of your home.  Replace batteries at least once a year.  Skin cancer- When out in the sun, cover up and use sunscreen 15 SPF or higher.  Violence- If anyone is threatening you, please tell your healthcare provider.  Living Will/ Health care power of attorney- Speak with your healthcare provider and family.

## 2012-12-27 NOTE — Progress Notes (Signed)
Subjective:    Gregory Cuevas is a 79 y.o. male who presents for Medicare Annual/Subsequent preventive examination.   Preventive Screening-Counseling & Management  Tobacco History  Smoking status  . Never Smoker   Smokeless tobacco  . Not on file    Problems Prior to Visit 1. Lower back pain off and on for last 2 days. Denies any painful urination,fever, chills. Denies any trauma or injury.   Current Problems (verified) Patient Active Problem List   Diagnosis Date Noted  . HYPERLIPIDEMIA 02/11/2010  . COLON CANCER 02/10/2010  . ESSENTIAL HYPERTENSION, BENIGN 02/10/2010    Medications Prior to Visit Current Outpatient Prescriptions on File Prior to Visit  Medication Sig Dispense Refill  . aspirin 81 MG tablet Take 81 mg by mouth daily.       No current facility-administered medications on file prior to visit.    Current Medications (verified) Current Outpatient Prescriptions  Medication Sig Dispense Refill  . aspirin 81 MG tablet Take 81 mg by mouth daily.      . AMBULATORY NON FORMULARY MEDICATION zostavax IM once.  1 application  0  . metoprolol (LOPRESSOR) 50 MG tablet Take 1 tablet (50 mg total) by mouth 2 (two) times daily.  60 tablet  1  . pravastatin (PRAVACHOL) 40 MG tablet Take 1 tablet (40 mg total) by mouth daily.  30 tablet  1   No current facility-administered medications for this visit.     Allergies (verified) Review of patient's allergies indicates no known allergies.   PAST HISTORY  Family History Family History  Problem Relation Age of Onset  . Liver cancer Father     deceased  . Cancer Father     liver  . Alzheimer's disease      2 sisters  . Colon cancer      2 brothers  . Esophageal cancer    . Cancer Brother     colon and esophageal    Social History History  Substance Use Topics  . Smoking status: Never Smoker   . Smokeless tobacco: Not on file  . Alcohol Use: No    Are there smokers in your home (other than you)?  No  Risk  Factors Current exercise habits: The patient does not participate in regular exercise at present.  Dietary issues discussed: none   Cardiac risk factors: advanced age (older than 21 for men, 63 for women), dyslipidemia and hypertension.  Depression Screen (Note: if answer to either of the following is "Yes", a more complete depression screening is indicated)   Q1: Over the past two weeks, have you felt down, depressed or hopeless? No  Q2: Over the past two weeks, have you felt little interest or pleasure in doing things? No  Have you lost interest or pleasure in daily life? No  Do you often feel hopeless? No  Do you cry easily over simple problems? No  Activities of Daily Living In your present state of health, do you have any difficulty performing the following activities?:  Driving? No Managing money?  No Feeding yourself? No Getting from bed to chair? No Climbing a flight of stairs? No Preparing food and eating?: No Bathing or showering? No Getting dressed: No Getting to the toilet? No Using the toilet:No Moving around from place to place: No In the past year have you fallen or had a near fall?:No   Are you sexually active?  No  Do you have more than one partner?  No  Hearing Difficulties: No Do  you often ask people to speak up or repeat themselves? No Do you experience ringing or noises in your ears? No Do you have difficulty understanding soft or whispered voices? No   Do you feel that you have a problem with memory? No  Do you often misplace items? No  Do you feel safe at home?  Yes  Cognitive Testing  Alert? Yes  Normal Appearance?Yes  Oriented to person? Yes  Place? Yes   Time? Yes  Recall of three objects?  Yes  Can perform simple calculations? Yes  Displays appropriate judgment?Yes  Can read the correct time from a watch face?Yes   Advanced Directives have been discussed with the patient? Yes   List the Names of Other Physician/Practitioners you  currently use: 1.    Indicate any recent Medical Services you may have received from other than [facility] providers in the past year (date may be approximate).  Immunization History  Administered Date(s) Administered  . Influenza,inj,Quad PF,36+ Mos 12/25/2012  . Pneumococcal Polysaccharide-23 03/17/2010  . Td 03/17/2010  . Zoster 12/25/2012    Screening Tests Health Maintenance  Topic Date Due  . Influenza Vaccine  08/16/2013  . Tetanus/tdap  03/16/2020  . Colonoscopy  04/19/2020  . Pneumococcal Polysaccharide Vaccine Age 27 And Over  Completed  . Zostavax  Completed    All answers were reviewed with the patient and necessary referrals were made:  [facility], JADE, PA-C   12/27/2012   History reviewed: allergies, current medications, past family history, past medical history, past social history, past surgical history and problem list  Review of Systems A comprehensive review of systems was negative.    Objective:     Vision by Snellen chart: right eye:20/20, left eye:20/20 Blood pressure 161/64, pulse 60, weight 133 lb (60.328 kg). Body mass index is 21.48 kg/(m^2).  BP 161/64  Pulse 60  Wt 133 lb (60.328 kg)  General Appearance:    Alert, cooperative, no distress, appears stated age  Head:    Normocephalic, without obvious abnormality, atraumatic  Eyes:    PERRL, conjunctiva/corneas clear, EOM's intact, fundi    benign, both eyes       Ears:    Normal TM's and external ear canals, both ears  Nose:   Nares normal, septum midline, mucosa normal, no drainage    or sinus tenderness  Throat:   Lips, mucosa, and tongue normal; teeth and gums normal  Neck:   Supple, symmetrical, trachea midline, no adenopathy;       thyroid:  No enlargement/tenderness/nodules; no carotid   bruit or JVD  Back:     Symmetric, no curvature, ROM normal, no CVA tenderness  Lungs:     Clear to auscultation bilaterally, respirations unlabored  Chest wall:    No tenderness or deformity  Heart:     Regular rate and rhythm, S1 and S2 normal, no murmur, rub   or gallop  Abdomen:     Soft, non-tender, bowel sounds active all four quadrants,    no masses, no organomegaly  Genitalia:    Normal male without lesion, discharge or tenderness  Rectal:    Normal tone, normal prostate, no masses or tenderness;   guaiac negative stool  Extremities:   Extremities normal, atraumatic, no cyanosis or edema. Tightness bilateral paraspinus muscles of lumbar spine. ROM normal and without pain.   Pulses:   2+ and symmetric all extremities  Skin:   Skin color, texture, turgor normal, no rashes or lesions  Lymph nodes:  Cervical, supraclavicular, and axillary nodes normal  Neurologic:   CNII-XII intact. Normal strength, sensation and reflexes      throughout       Assessment:    Normal healthy male exam.       Plan:     During the course of the visit the patient was educated and counseled about appropriate screening and preventive services including:    Prostate cancer screening  Diabetes screening  Zostavax.  CPE- PSA ordered. AuA was 7. Pt denies any symptoms. Gave rx for zostavax. Flu shot given.   Hypertension- pt currently not taking medication. Refilled medication. Follow up in 1 month for BP recheck.  Hyperlipidemia- Sent for fasting labs do not want to get today because pt has not been on statin b/c ran out. Refilled statin will recheck in 2 months.   Muscle strain- Gave exercises. Checked UA and was negative for blood, leuks, nitrates. Ibuprofen suggested. Call if not improving.   Diet review for nutrition referral?  Not Indicated ____   Patient Instructions (the written plan) was given to the patient.  Medicare Attestation I have personally reviewed: The patient's medical and social history Their use of alcohol, tobacco or illicit drugs Their current medications and supplements The patient's functional ability including ADLs,fall risks, home safety risks, cognitive, and  hearing and visual impairment Diet and physical activities Evidence for depression or mood disorders  The patient's weight, height, BMI, and visual acuity have been recorded in the chart.  I have made referrals, counseling, and provided education to the patient based on review of the above and I have provided the patient with a written personalized care plan for preventive services.     Tandy Gaw, PA-C   12/27/2012

## 2013-01-31 LAB — COMPLETE METABOLIC PANEL WITH GFR
ALT: 13 U/L (ref 0–53)
AST: 17 U/L (ref 0–37)
Albumin: 3.9 g/dL (ref 3.5–5.2)
Alkaline Phosphatase: 52 U/L (ref 39–117)
BUN: 16 mg/dL (ref 6–23)
CO2: 28 mEq/L (ref 19–32)
Calcium: 8.9 mg/dL (ref 8.4–10.5)
Chloride: 101 mEq/L (ref 96–112)
Creat: 0.98 mg/dL (ref 0.50–1.35)
GFR, Est African American: 86 mL/min
GFR, Est Non African American: 74 mL/min
Glucose, Bld: 78 mg/dL (ref 70–99)
Potassium: 4.7 mEq/L (ref 3.5–5.3)
Sodium: 135 mEq/L (ref 135–145)
Total Bilirubin: 1 mg/dL (ref 0.3–1.2)
Total Protein: 6.1 g/dL (ref 6.0–8.3)

## 2013-01-31 LAB — LIPID PANEL
Cholesterol: 213 mg/dL — ABNORMAL HIGH (ref 0–200)
HDL: 66 mg/dL (ref >39)
LDL Cholesterol: 128 mg/dL — ABNORMAL HIGH (ref 0–99)
Total CHOL/HDL Ratio: 3.2 Ratio
Triglycerides: 96 mg/dL (ref <150)
VLDL: 19 mg/dL (ref 0–40)

## 2013-01-31 LAB — PSA: PSA: 0.57 ng/mL (ref <=4.00)

## 2013-04-16 ENCOUNTER — Ambulatory Visit (INDEPENDENT_AMBULATORY_CARE_PROVIDER_SITE_OTHER): Payer: Medicare Other | Admitting: Physician Assistant

## 2013-04-16 ENCOUNTER — Encounter: Payer: Self-pay | Admitting: Physician Assistant

## 2013-04-16 VITALS — BP 144/67 | HR 61 | Ht 66.0 in | Wt 133.0 lb

## 2013-04-16 DIAGNOSIS — E785 Hyperlipidemia, unspecified: Secondary | ICD-10-CM

## 2013-04-16 DIAGNOSIS — I10 Essential (primary) hypertension: Secondary | ICD-10-CM

## 2013-04-16 MED ORDER — PRAVASTATIN SODIUM 40 MG PO TABS: 40.0000 mg | ORAL_TABLET | Freq: Every day | ORAL | Status: DC

## 2013-04-16 MED ORDER — METOPROLOL TARTRATE 50 MG PO TABS: 50.0000 mg | ORAL_TABLET | Freq: Two times a day (BID) | ORAL | Status: DC

## 2013-04-16 NOTE — Progress Notes (Signed)
   Subjective:    Patient ID: Gregory Cuevas, male    DOB: 25-Aug-1933, 80 y.o.   MRN: [id number]  HPI Pt is a 80 yo male who presents to the clinic to follow up on HTN after elevated BP at CPE. Doing great today. Denies any CP, palpitations, SOB, headaches or vision changes. Has not taken BP med in last 3 days because ran out.   Hyperlipidemia- taking pravachol daily. Walking 2-3 times a week.    Review of Systems     Objective:   Physical Exam  Constitutional: He is oriented to person, place, and time. He appears well-developed and well-nourished.  HENT:  Head: Normocephalic and atraumatic.  Neck: Normal range of motion. Neck supple. No thyromegaly present.  Cardiovascular: Normal rate, regular rhythm and normal heart sounds.   Pulmonary/Chest: Effort normal and breath sounds normal.  Neurological: He is alert and oriented to person, place, and time.  Psychiatric: He has a normal mood and affect. His behavior is normal.          Assessment & Plan:  HTN- not taken in last 3 days. Likely when started back will look great. Goal under 150/90 and that has been reached.Refilled metoprolol for 6 months. Follow up then.   Hyperlipidemia- Refilled Pravachol for 1 year. Continue low fat diet and regular exercise. Last LDL was 128.   Depression 0/2. Fall risk low.

## 2013-07-14 ENCOUNTER — Ambulatory Visit (INDEPENDENT_AMBULATORY_CARE_PROVIDER_SITE_OTHER): Payer: Medicare Other | Admitting: Physician Assistant

## 2013-07-14 ENCOUNTER — Encounter: Payer: Self-pay | Admitting: Physician Assistant

## 2013-07-14 VITALS — BP 139/67 | HR 58 | Ht 66.0 in | Wt 132.0 lb

## 2013-07-14 DIAGNOSIS — K6289 Other specified diseases of anus and rectum: Secondary | ICD-10-CM

## 2013-07-14 DIAGNOSIS — R198 Other specified symptoms and signs involving the digestive system and abdomen: Secondary | ICD-10-CM

## 2013-07-14 MED ORDER — HYDROCORTISONE 2.5 % RE CREA: 1.0000 "application " | TOPICAL_CREAM | Freq: Two times a day (BID) | RECTAL | Status: DC

## 2013-07-14 NOTE — Progress Notes (Signed)
   Subjective:    Patient ID: Gregory Cuevas, male    DOB: February 24, 1933, 80 y.o.   MRN: [id number]  HPI Pt is a 80 yo male who presents to the clinic with rectal mass and blood in stool. No pain. Denies any constipation. Noticed for last week and a half. Tried hydrocortisone OTC no relief. Feeling a little bit of pressure in rectal area. No fever, chills, n/v/d. Blood is bright red. Bowel movements are unchanged and normal.    Review of Systems     Objective:   Physical Exam  Constitutional: He appears well-developed and well-nourished.  Genitourinary:     Psychiatric: He has a normal mood and affect.          Assessment & Plan:  Rectal mass- rectal cream was given to see if he might have any benefit. Warm sitz baths encouraged. Discussed with patient that mass does not appear like a usual hemorrhoid and does look suspiocous. i would like to send him to general surgery to have biopsy. Concerned for melanoma vs keratoacanthoma.

## 2013-07-14 NOTE — Patient Instructions (Signed)

## 2013-07-30 ENCOUNTER — Ambulatory Visit (INDEPENDENT_AMBULATORY_CARE_PROVIDER_SITE_OTHER): Payer: Medicare Other | Admitting: General Surgery

## 2013-07-30 ENCOUNTER — Encounter (INDEPENDENT_AMBULATORY_CARE_PROVIDER_SITE_OTHER): Payer: Self-pay | Admitting: General Surgery

## 2013-07-30 VITALS — BP 138/72 | HR 73 | Temp 98.0°F | Ht 65.0 in | Wt 130.0 lb

## 2013-07-30 DIAGNOSIS — K645 Perianal venous thrombosis: Secondary | ICD-10-CM

## 2013-07-30 NOTE — Progress Notes (Signed)
Chief Complaint  Patient presents with  . rectal mass    HISTORY: Gregory Cuevas is a 80 y.o. male who presents to the office with a 2-3 week h/o a bleeding anal mass.  He denies any pain and the bleeding is better.  This had been occurring for several weeks.  he has tried a hemorrhoid cream in the past with good success.  Nothing makes the symptoms worse.   It is intermittent in nature.  his bowel habits are regular and his bowel movements are soft.  He denies any straining.  his fiber intake is dietary.     Past Medical History  Diagnosis Date  . Colon cancer 1990's  . Detached retina   . AMI (acute myocardial infarction) 1980      Past Surgical History  Procedure Laterality Date  . Partial colectomy    . Eye surgery          Current Outpatient Prescriptions  Medication Sig Dispense Refill  . AMBULATORY NON FORMULARY MEDICATION zostavax IM once.  1 application  0  . aspirin 81 MG tablet Take 81 mg by mouth daily.      . hydrocortisone (ANUSOL-HC) 2.5 % rectal cream Place 1 application rectally 2 (two) times daily.  30 g  0  . metoprolol (LOPRESSOR) 50 MG tablet Take 1 tablet (50 mg total) by mouth 2 (two) times daily.  60 tablet  5  . pravastatin (PRAVACHOL) 40 MG tablet Take 1 tablet (40 mg total) by mouth daily.  30 tablet  11   No current facility-administered medications for this visit.      No Known Allergies    Family History  Problem Relation Age of Onset  . Liver cancer Father     deceased  . Cancer Father     liver  . Alzheimer's disease      2 sisters  . Colon cancer      2 brothers  . Esophageal cancer    . Cancer Brother     colon and esophageal    History   Social History  . Marital Status: Married    Spouse Name: N/A    Number of Children: N/A  . Years of Education: N/A   Social History Main Topics  . Smoking status: Never Smoker   . Smokeless tobacco: None  . Alcohol Use: No  . Drug Use: No  . Sexual Activity:    Other Topics Concern  . None    Social History Narrative  . None      REVIEW OF SYSTEMS - PERTINENT POSITIVES ONLY: Review of Systems - General ROS: negative for - chills, fever or weight loss Hematological and Lymphatic ROS: negative for - bleeding problems, blood clots or bruising Respiratory ROS: no cough, shortness of breath, or wheezing Cardiovascular ROS: no chest pain or dyspnea on exertion Gastrointestinal ROS: no abdominal pain, change in bowel habits, or black or bloody stools Genito-Urinary ROS: no dysuria, trouble voiding, or hematuria  EXAM: Filed Vitals:   07/30/13 1443  BP: 138/72  Pulse: 73  Temp: 98 F (36.7 C)    General appearance: alert and cooperative Resp: clear to auscultation bilaterally Cardio: regular rate and rhythm GI: soft, non-tender; bowel sounds normal; no masses,  no organomegaly  Procedure: Anoscopy Surgeon: Marcello Moores Diagnosis: anal mass  Assistant: Moffit After the risks and benefits were explained, verbal consent was obtained for above procedure  Anesthesia: none Findings: healing thrombosed hemorrhoid, minimal internal hemorrhoid inflammation    ASSESSMENT  AND PLAN: Gregory Cuevas is a 80 y.o. male that appears to have had a thrombosed hemorrhoid.  It appears to be resolving.  I do not see any other masses or abnormalities.  Follow up PRN.    Rosario Adie, MD Colon and Rectal Surgery / [facility]      Visit Diagnoses: No diagnosis found.  Primary Care Physician: [name], PA-C

## 2013-07-30 NOTE — Patient Instructions (Signed)

## 2014-01-19 ENCOUNTER — Ambulatory Visit (INDEPENDENT_AMBULATORY_CARE_PROVIDER_SITE_OTHER): Payer: Medicare Other

## 2014-01-19 ENCOUNTER — Encounter: Payer: Self-pay | Admitting: Physician Assistant

## 2014-01-19 ENCOUNTER — Ambulatory Visit (INDEPENDENT_AMBULATORY_CARE_PROVIDER_SITE_OTHER): Payer: Medicare Other | Admitting: Physician Assistant

## 2014-01-19 VITALS — BP 147/90 | HR 62 | Temp 97.7°F | Ht 65.0 in | Wt 135.0 lb

## 2014-01-19 DIAGNOSIS — I517 Cardiomegaly: Secondary | ICD-10-CM | POA: Insufficient documentation

## 2014-01-19 DIAGNOSIS — R05 Cough: Secondary | ICD-10-CM

## 2014-01-19 DIAGNOSIS — J0100 Acute maxillary sinusitis, unspecified: Secondary | ICD-10-CM

## 2014-01-19 DIAGNOSIS — R059 Cough, unspecified: Secondary | ICD-10-CM

## 2014-01-19 DIAGNOSIS — R0989 Other specified symptoms and signs involving the circulatory and respiratory systems: Secondary | ICD-10-CM

## 2014-01-19 MED ORDER — AZITHROMYCIN 250 MG PO TABS: ORAL_TABLET | ORAL | Status: DC

## 2014-01-19 MED ORDER — HYDROCOD POLST-CHLORPHEN POLST 10-8 MG/5ML PO LQCR: 5.0000 mL | Freq: Two times a day (BID) | ORAL | Status: DC | PRN

## 2014-01-19 MED ORDER — FLUTICASONE PROPIONATE 50 MCG/ACT NA SUSP: 2.0000 | Freq: Every day | NASAL | Status: DC

## 2014-01-19 MED ORDER — METHYLPREDNISOLONE SODIUM SUCC 125 MG IJ SOLR
125.0000 mg | Freq: Once | INTRAMUSCULAR | Status: AC
  Administered 2014-01-19: 125 mg via INTRAMUSCULAR

## 2014-01-19 NOTE — Patient Instructions (Signed)

## 2014-01-19 NOTE — Progress Notes (Signed)
   Subjective:    Patient ID: Gregory Cuevas, male    DOB: Aug 21, 1933, 81 y.o.   MRN: [id number]  HPI  Pt presents to the clinic with complaints of 2 weeks of UrI symptoms sinus pressure, headache, nasal congestion. Has a cough. Worse at night. Not very productive. No ear pain or ST today. No fever, chills, n/v/d. Tried ibuprofen with little relief.     Review of Systems  All other systems reviewed and are negative.      Objective:   Physical Exam  Constitutional: He is oriented to person, place, and time. He appears well-developed and well-nourished.  HENT:  Head: Normocephalic and atraumatic.  Right Ear: External ear normal.  Left Ear: External ear normal.  TM's clear bilaterally.  Oropharynx erythematous without tonsilar exudate some PND present.  Maxillary and frontal sinus tenderness to palpation.  Bilateral nasal turbinates red and swollen.   Eyes: Conjunctivae are normal. Right eye exhibits no discharge. Left eye exhibits no discharge.  Neck: Normal range of motion. Neck supple.  Cardiovascular: Normal rate, regular rhythm and normal heart sounds.   Pulmonary/Chest: Effort normal and breath sounds normal. He has no wheezes.  Lymphadenopathy:    He has no cervical adenopathy.  Neurological: He is alert and oriented to person, place, and time.  Skin: Skin is dry.  Psychiatric: He has a normal mood and affect. His behavior is normal.          Assessment & Plan:  Acute maxillary sinusitis/cough- due to cough for 2 weeks will get CXR. Solumedrol 125mg IM given today. zpak started. tussionex given for cough at bedtime. flonase to start 2 sprays each nostril daily. Other symptomatic care discussed. Follow up as needed or if symptoms worsening.

## 2014-01-26 ENCOUNTER — Encounter: Payer: Self-pay | Admitting: Physician Assistant

## 2014-01-26 ENCOUNTER — Ambulatory Visit (INDEPENDENT_AMBULATORY_CARE_PROVIDER_SITE_OTHER): Payer: Medicare Other | Admitting: Physician Assistant

## 2014-01-26 VITALS — BP 130/67 | HR 59 | Temp 97.4°F | Wt 134.0 lb

## 2014-01-26 DIAGNOSIS — R0981 Nasal congestion: Secondary | ICD-10-CM | POA: Insufficient documentation

## 2014-01-26 DIAGNOSIS — I517 Cardiomegaly: Secondary | ICD-10-CM

## 2014-01-26 DIAGNOSIS — J3489 Other specified disorders of nose and nasal sinuses: Secondary | ICD-10-CM | POA: Insufficient documentation

## 2014-01-26 MED ORDER — AZELASTINE HCL 0.1 % NA SOLN: 2.0000 | Freq: Two times a day (BID) | NASAL | Status: DC

## 2014-01-26 MED ORDER — METHYLPREDNISOLONE SODIUM SUCC 125 MG IJ SOLR
125.0000 mg | Freq: Once | INTRAMUSCULAR | Status: AC
  Administered 2014-01-26: 125 mg via INTRAMUSCULAR

## 2014-01-26 NOTE — Progress Notes (Signed)
   Subjective:    Patient ID: Gregory Cuevas, male    DOB: 13-Dec-1933, 81 y.o.   MRN: [id number]  HPI Pt presents to the clinic still feeling bad. He does admit to feeling better. No cough, CP, SOB, wheezing. He is 80 percent better. Finished zpak. He continues to have nasal congestion and rhinorrhea off and on with some frontal head pressure. No fever, chills. Not taking anything currently. He request one more shot of steriod because that seemed to help the most. He is using flonase regularly.    Review of Systems  All other systems reviewed and are negative.      Objective:   Physical Exam  Constitutional: He is oriented to person, place, and time. He appears well-developed and well-nourished.  HENT:  Head: Normocephalic and atraumatic.  Right Ear: External ear normal.  Left Ear: External ear normal.  Nose: Nose normal.  Mouth/Throat: Oropharynx is clear and moist.  TM's clear bilaterally.   Nasal turbinates patent with minimal swelling. Some residual rhinorrhea present.   Eyes: Conjunctivae are normal. Right eye exhibits no discharge. Left eye exhibits no discharge.  Neck: Normal range of motion. Neck supple. No thyromegaly present.  Cardiovascular: Normal rate, regular rhythm and normal heart sounds.   Pulmonary/Chest: Effort normal and breath sounds normal. He has no wheezes.  Lymphadenopathy:    He has no cervical adenopathy.  Neurological: He is alert and oriented to person, place, and time.  Skin: Skin is dry.  Psychiatric: He has a normal mood and affect. His behavior is normal.          Assessment & Plan:  Nasal congestion/rhinorrhea- given astelin to try bid for anti-histamine approach to running nose and congestion. Solumedrol 125mg  IM given today. Follow up as needed. Discussed and reassured pt that no further abx was needed.    Cardiomegaly on CXR-  EKG- NSR at 63 with sinus arrthrythmia. No st elevation or depression. ?atrial enlargement.  Will order echo for  further evaluation.

## 2014-01-26 NOTE — Patient Instructions (Signed)
Shot of solumedrol 125mg  IM today.

## 2014-01-27 ENCOUNTER — Telehealth: Payer: Self-pay | Admitting: *Deleted

## 2014-01-27 NOTE — Telephone Encounter (Signed)
2d echo prior auth submitted to [facility] - awaiting decision.

## 2014-01-29 NOTE — Telephone Encounter (Signed)
[employer] called today requesting medical records - they were faxed to [phone] - Case ID# [id number]

## 2014-02-19 NOTE — Telephone Encounter (Signed)
Abigail Butts printed order for 2D echo and will handle PA.

## 2014-02-20 ENCOUNTER — Telehealth: Payer: Self-pay

## 2014-02-20 NOTE — Telephone Encounter (Signed)
Echo/Cardiomagaly/ Dr Alden Hipp  call [phone] preauth # [id number] Jennifer/csc I left all appt info at [phone] [place] scheduling number [phone] incase pt has ANY questions

## 2014-02-25 ENCOUNTER — Other Ambulatory Visit (HOSPITAL_COMMUNITY): Payer: Self-pay | Admitting: Physician Assistant

## 2014-02-25 ENCOUNTER — Ambulatory Visit (HOSPITAL_BASED_OUTPATIENT_CLINIC_OR_DEPARTMENT_OTHER)
Admission: RE | Admit: 2014-02-25 | Discharge: 2014-02-25 | Disposition: A | Discharge status: Home / Self Care | Payer: Medicare Other | Source: Ambulatory Visit | Attending: Physician Assistant | Admitting: Physician Assistant

## 2014-02-25 DIAGNOSIS — I517 Cardiomegaly: Secondary | ICD-10-CM | POA: Diagnosis not present

## 2014-02-25 DIAGNOSIS — R0600 Dyspnea, unspecified: Secondary | ICD-10-CM | POA: Diagnosis present

## 2014-02-25 NOTE — Progress Notes (Signed)
  Echocardiogram 2D Echocardiogram has been performed.  Gregory Cuevas 02/25/2014, 12:35 PM

## 2014-03-02 ENCOUNTER — Other Ambulatory Visit: Payer: Self-pay | Admitting: Physician Assistant

## 2014-03-02 DIAGNOSIS — Q211 Atrial septal defect, unspecified: Secondary | ICD-10-CM | POA: Insufficient documentation

## 2014-03-02 DIAGNOSIS — I351 Nonrheumatic aortic (valve) insufficiency: Secondary | ICD-10-CM

## 2014-03-02 DIAGNOSIS — I5189 Other ill-defined heart diseases: Secondary | ICD-10-CM

## 2014-05-27 ENCOUNTER — Other Ambulatory Visit: Payer: Self-pay | Admitting: Family Medicine

## 2014-05-27 ENCOUNTER — Ambulatory Visit (INDEPENDENT_AMBULATORY_CARE_PROVIDER_SITE_OTHER): Payer: Medicare Other | Admitting: Family Medicine

## 2014-05-27 ENCOUNTER — Ambulatory Visit (HOSPITAL_BASED_OUTPATIENT_CLINIC_OR_DEPARTMENT_OTHER)
Admission: RE | Admit: 2014-05-27 | Discharge: 2014-05-27 | Disposition: A | Discharge status: Home / Self Care | Payer: Medicare Other | Source: Ambulatory Visit | Attending: Family Medicine | Admitting: Family Medicine

## 2014-05-27 ENCOUNTER — Encounter: Payer: Self-pay | Admitting: Family Medicine

## 2014-05-27 VITALS — BP 130/71 | HR 60 | Wt 133.0 lb

## 2014-05-27 DIAGNOSIS — M7989 Other specified soft tissue disorders: Secondary | ICD-10-CM

## 2014-05-27 DIAGNOSIS — R600 Localized edema: Secondary | ICD-10-CM | POA: Insufficient documentation

## 2014-05-27 DIAGNOSIS — L539 Erythematous condition, unspecified: Secondary | ICD-10-CM

## 2014-05-27 LAB — CBC WITH DIFFERENTIAL/PLATELET
Basophils Absolute: 0 10*3/uL (ref 0.0–0.1)
Basophils Relative: 0 % (ref 0–1)
Eosinophils Absolute: 0.2 10*3/uL (ref 0.0–0.7)
Eosinophils Relative: 5 % (ref 0–5)
HEMATOCRIT: 42 % (ref 39.0–52.0)
HEMOGLOBIN: 14.1 g/dL (ref 13.0–17.0)
LYMPHS ABS: 1.3 10*3/uL (ref 0.7–4.0)
Lymphocytes Relative: 28 % (ref 12–46)
MCH: 28.4 pg (ref 26.0–34.0)
MCHC: 33.6 g/dL (ref 30.0–36.0)
MCV: 84.5 fL (ref 78.0–100.0)
MONOS PCT: 12 % (ref 3–12)
MPV: 9.3 fL (ref 8.6–12.4)
Monocytes Absolute: 0.6 10*3/uL (ref 0.1–1.0)
NEUTROS ABS: 2.6 10*3/uL (ref 1.7–7.7)
NEUTROS PCT: 55 % (ref 43–77)
Platelets: 226 10*3/uL (ref 150–400)
RBC: 4.97 MIL/uL (ref 4.22–5.81)
RDW: 13.5 % (ref 11.5–15.5)
WBC: 4.8 10*3/uL (ref 4.0–10.5)

## 2014-05-27 LAB — COMPLETE METABOLIC PANEL WITH GFR
ALT: 19 U/L (ref 0–53)
AST: 23 U/L (ref 0–37)
Albumin: 3.6 g/dL (ref 3.5–5.2)
Alkaline Phosphatase: 58 U/L (ref 39–117)
BUN: 18 mg/dL (ref 6–23)
CHLORIDE: 98 meq/L (ref 96–112)
CO2: 26 meq/L (ref 19–32)
CREATININE: 0.89 mg/dL (ref 0.50–1.35)
Calcium: 8.9 mg/dL (ref 8.4–10.5)
GFR, Est African American: >89 mL/min
GFR, Est Non African American: 81 mL/min
Glucose, Bld: 87 mg/dL (ref 70–99)
Potassium: 4.5 mEq/L (ref 3.5–5.3)
Sodium: 133 mEq/L — ABNORMAL LOW (ref 135–145)
TOTAL PROTEIN: 6 g/dL (ref 6.0–8.3)
Total Bilirubin: 0.8 mg/dL (ref 0.2–1.2)

## 2014-05-27 LAB — TSH: TSH: 0.735 u[IU]/mL (ref 0.350–4.500)

## 2014-05-27 MED ORDER — SULFAMETHOXAZOLE-TRIMETHOPRIM 800-160 MG PO TABS: 1.0000 | ORAL_TABLET | Freq: Two times a day (BID) | ORAL | Status: DC

## 2014-05-27 NOTE — Progress Notes (Signed)
   Subjective:    Patient ID: Gregory Cuevas, male    DOB: 1933-07-08, 81 y.o.   MRN: [id number]  HPI 81 year old male with a history of colon cancer and aortic insufficiency with atrial septal defect comes in today complaining of leg pain. He bumped into something 3 weeks ago and cut the upper back part of the calf on his left leg. He felt like that was healing well but over the last week or 2 he is started noticed some swelling over the lower part of the leg near the ankle.  He denies any repeat trauma. No fevers chills or sweats. No increased warmth or heat. He's not taking any medications. He says it feels a little sore at times but otherwise doesn't actually bother him much as far as pain is considered. He says the swelling actually is almost gone in the mornings when he first gets up but continues to get worse throughout the day as soon as he gets up and starts walking. He's never had swelling before. He does report a history of trauma to that leg almost 40 years ago while playing soccer. In fact he suffered some type of infection that are most indeterminate in an amputation but they were able to save his leg. He said he has had some palms on and off with his leg since then.    Note: He is no longer taking his pravastatin or metoprolol. He decided to stop these and take herbal remedies instead.   Review of Systems     Objective:   Physical Exam  Constitutional: He is oriented to person, place, and time. He appears well-developed and well-nourished.  HENT:  Head: Normocephalic and atraumatic.  Cardiovascular: Normal rate.   Musculoskeletal:  Left leg with small scar on posterior thigh is healing well.  On the lower part of the lower leg has some mild erythema that is now well demarcated.  1+ edema around the ankle.  DP pulse is faints and unable to palpate the posterior tibial pulse.    Neurological: He is alert and oriented to person, place, and time.  Skin: Skin is warm and dry.  Psychiatric:  He has a normal mood and affect. His behavior is normal.          Assessment & Plan:  Left leg swelling - rule out DVT .  Will check for change in thyroid or renal function as well. Will check CBC since some mild erythema.  Will call with doppler results once available.  If negative then consider a trial of antibiotics for the erythema and treat for possible early cellulitis as well as adding compression wraps with Ace wrap or compression stocking.

## 2014-05-28 LAB — URINALYSIS
Bilirubin Urine: NEGATIVE
Glucose, UA: NEGATIVE mg/dL
HGB URINE DIPSTICK: NEGATIVE
KETONES UR: NEGATIVE mg/dL
LEUKOCYTES UA: NEGATIVE
Nitrite: NEGATIVE
PH: 8 (ref 5.0–8.0)
PROTEIN: NEGATIVE mg/dL
Specific Gravity, Urine: 1.006 (ref 1.005–1.030)
UROBILINOGEN UA: 0.2 mg/dL (ref 0.0–1.0)

## 2014-05-28 LAB — SEDIMENTATION RATE: Sed Rate: 1 mm/hr (ref 0–20)

## 2014-05-29 ENCOUNTER — Other Ambulatory Visit: Payer: Self-pay | Admitting: *Deleted

## 2014-05-29 DIAGNOSIS — E871 Hypo-osmolality and hyponatremia: Secondary | ICD-10-CM

## 2014-06-17 ENCOUNTER — Ambulatory Visit (INDEPENDENT_AMBULATORY_CARE_PROVIDER_SITE_OTHER): Payer: Medicare Other | Admitting: Family Medicine

## 2014-06-17 ENCOUNTER — Encounter: Payer: Self-pay | Admitting: Family Medicine

## 2014-06-17 VITALS — BP 135/61 | HR 74 | Ht 65.0 in | Wt 134.0 lb

## 2014-06-17 DIAGNOSIS — E871 Hypo-osmolality and hyponatremia: Secondary | ICD-10-CM

## 2014-06-17 DIAGNOSIS — M7989 Other specified soft tissue disorders: Secondary | ICD-10-CM

## 2014-06-17 NOTE — Progress Notes (Signed)
   Subjective:    Patient ID: Gregory Cuevas, male    DOB: [birth year], 81 y.o.   MRN: [id number]  HPI Here today to follow-up for left leg swelling. He was seen about 3 weeks ago. We sent him for Doppler to rule out DVT and it was negative. Because it was also some redness we had him take Bactrim. He says it looks much better. The swelling has resolved and pain is gone. He still has some chronic pain in that leg from old injuries when he played soccer for 12 years but otherwise he is fine. He did want to go over his blood work today.   Review of Systems     Objective:   Physical Exam  Constitutional: He appears well-developed and well-nourished.  Musculoskeletal:  Left leg looks great today. No significant erythema. He does have some trace ankle edema but I believe this is chronic as he has a fair amount of large varicose veins over the calf and around the ankle. Old scars are well healed. No open wounds.  Skin: Skin is dry.  Psychiatric: He has a normal mood and affect. His behavior is normal.          Assessment & Plan:  Left leg swelling-completely resolved after completing course of anti-biotic. I do feel like his symptoms were most consistent with acute cellulitis especially after ruling out DVT and since he had such a fantastic response to the [employer]. Continue to keep an eye on the leg and if he notices any recurrent swelling or redness to please come in sooner rather than later. Next  Hyponatremia-noted on bloodwork. Sodium was 133. We'll repeat today for verification. Previous sodium levels have actually been normal.

## 2014-06-18 LAB — BASIC METABOLIC PANEL
BUN: 28 mg/dL — ABNORMAL HIGH (ref 6–23)
CHLORIDE: 101 meq/L (ref 96–112)
CO2: 26 mEq/L (ref 19–32)
CREATININE: 0.94 mg/dL (ref 0.50–1.35)
Calcium: 9.2 mg/dL (ref 8.4–10.5)
Glucose, Bld: 81 mg/dL (ref 70–99)
Potassium: 4.6 mEq/L (ref 3.5–5.3)
Sodium: 139 mEq/L (ref 135–145)

## 2014-10-28 ENCOUNTER — Telehealth: Payer: Self-pay | Admitting: *Deleted

## 2014-10-28 NOTE — Telephone Encounter (Signed)
Nurse Practitioner from the [facility] was with the pt and wanted [place] to know that his blood pressure was elevated.  It was 162/72 and then 170/62 at the recheck.  He informed her that he had stopped taking the metoprolol and his cholesterol medication about 3 months ago.  I asked about pulse rate but she was driving and didn't have his exact numbers in front of her.  Please advise.

## 2014-10-29 ENCOUNTER — Other Ambulatory Visit: Payer: Self-pay | Admitting: *Deleted

## 2014-10-29 MED ORDER — METOPROLOL TARTRATE 50 MG PO TABS: 50.0000 mg | ORAL_TABLET | Freq: Two times a day (BID) | ORAL | Status: DC

## 2014-10-29 NOTE — Telephone Encounter (Signed)
Called pt this morning.  He handed the phone to his wife (language barrier) and I advised her to have him start back on the metoprolol and I sent in refills for him as well.

## 2014-10-29 NOTE — Telephone Encounter (Signed)
Okay, Gregory Cuevas the patient directly and encouraged him to restart his medication. If he needs new prescriptions we can certainly send this over to the pharmacy.

## 2014-10-31 ENCOUNTER — Other Ambulatory Visit: Payer: Self-pay | Admitting: Physician Assistant

## 2014-11-03 ENCOUNTER — Encounter: Payer: Self-pay | Admitting: Physician Assistant

## 2014-11-03 ENCOUNTER — Other Ambulatory Visit: Payer: Self-pay | Admitting: Physician Assistant

## 2014-11-03 ENCOUNTER — Ambulatory Visit (INDEPENDENT_AMBULATORY_CARE_PROVIDER_SITE_OTHER): Payer: Medicare Other | Admitting: Physician Assistant

## 2014-11-03 VITALS — BP 156/54 | HR 47 | Ht 65.0 in | Wt 139.0 lb

## 2014-11-03 DIAGNOSIS — I10 Essential (primary) hypertension: Secondary | ICD-10-CM | POA: Diagnosis not present

## 2014-11-03 DIAGNOSIS — R42 Dizziness and giddiness: Secondary | ICD-10-CM | POA: Diagnosis not present

## 2014-11-03 DIAGNOSIS — E785 Hyperlipidemia, unspecified: Secondary | ICD-10-CM | POA: Diagnosis not present

## 2014-11-03 DIAGNOSIS — Z23 Encounter for immunization: Secondary | ICD-10-CM | POA: Diagnosis not present

## 2014-11-03 LAB — COMPLETE METABOLIC PANEL WITH GFR
ALBUMIN: 3.9 g/dL (ref 3.6–5.1)
ALK PHOS: 69 U/L (ref 40–115)
ALT: 12 U/L (ref 9–46)
AST: 16 U/L (ref 10–35)
BILIRUBIN TOTAL: 0.7 mg/dL (ref 0.2–1.2)
BUN: 19 mg/dL (ref 7–25)
CO2: 26 mmol/L (ref 20–31)
CREATININE: 0.92 mg/dL (ref 0.70–1.18)
Calcium: 9.2 mg/dL (ref 8.6–10.3)
Chloride: 99 mmol/L (ref 98–110)
GFR, EST NON AFRICAN AMERICAN: 79 mL/min (ref >=60)
GLUCOSE: 85 mg/dL (ref 65–99)
Potassium: 4.8 mmol/L (ref 3.5–5.3)
SODIUM: 134 mmol/L — AB (ref 135–146)
TOTAL PROTEIN: 6.2 g/dL (ref 6.1–8.1)

## 2014-11-03 LAB — LIPID PANEL
Cholesterol: 221 mg/dL — ABNORMAL HIGH (ref 125–200)
HDL: 71 mg/dL (ref >=40)
LDL Cholesterol: 129 mg/dL (ref <130)
Total CHOL/HDL Ratio: 3.1 Ratio (ref <=5.0)
Triglycerides: 103 mg/dL (ref <150)
VLDL: 21 mg/dL (ref <30)

## 2014-11-03 MED ORDER — LISINOPRIL-HYDROCHLOROTHIAZIDE 20-12.5 MG PO TABS: 1.0000 | ORAL_TABLET | Freq: Every day | ORAL | Status: DC

## 2014-11-03 MED ORDER — PRAVASTATIN SODIUM 40 MG PO TABS: 40.0000 mg | ORAL_TABLET | Freq: Every day | ORAL | Status: DC

## 2014-11-03 NOTE — Telephone Encounter (Signed)
Called pharmacy, Rx was received correctly.

## 2014-11-03 NOTE — Patient Instructions (Signed)
Stop metoprolol.  Start lisinopril/HCTZ daily.  Follow up in 4 weeks.

## 2014-11-06 NOTE — Progress Notes (Signed)
   Subjective:    Patient ID: Gregory Cuevas, male    DOB: 04-Feb-1933, 81 y.o.   MRN: [id number]  HPI Patient presents to the clinic to discuss medications. He admits he is not taking any of his medications. He complains of some off and on dizziness. He denies any chest pains, palpitations, shortness of breath or headaches. Does have diagnosed hyperlipidemia.   Review of Systems  All other systems reviewed and are negative.      Objective:   Physical Exam  Constitutional: He is oriented to person, place, and time. He appears well-developed and well-nourished.  HENT:  Head: Normocephalic and atraumatic.  Cardiovascular: Normal rate, regular rhythm and normal heart sounds.   Pulmonary/Chest: Effort normal and breath sounds normal. He has no wheezes.  Neurological: He is alert and oriented to person, place, and time.  Psychiatric: He has a normal mood and affect. His behavior is normal.          Assessment & Plan:  HTN/dizziness- I feel like dizziness could be coming from not taking BP medications and possible low HR from BB. Stop metoprolol and start lisinopril/HCTZ and recheck in 2 weeks and confirm transient dizziness is from BP being out of control.   Hyperlipidemia- pravastatin recommended to restart. Will recheck lipid today.   Flu shot given today.

## 2014-11-27 ENCOUNTER — Ambulatory Visit (INDEPENDENT_AMBULATORY_CARE_PROVIDER_SITE_OTHER): Payer: Medicare Other | Admitting: Physician Assistant

## 2014-11-27 ENCOUNTER — Encounter: Payer: Self-pay | Admitting: Physician Assistant

## 2014-11-27 VITALS — BP 150/62 | HR 57 | Ht 65.0 in | Wt 133.0 lb

## 2014-11-27 DIAGNOSIS — I10 Essential (primary) hypertension: Secondary | ICD-10-CM | POA: Diagnosis not present

## 2014-11-27 DIAGNOSIS — R42 Dizziness and giddiness: Secondary | ICD-10-CM

## 2014-11-27 MED ORDER — LISINOPRIL-HYDROCHLOROTHIAZIDE 20-12.5 MG PO TABS: 1.0000 | ORAL_TABLET | Freq: Every day | ORAL | Status: DC

## 2014-11-27 NOTE — Progress Notes (Signed)
   Subjective:    Patient ID: Gregory Cuevas, male    DOB: 1933/09/05, 81 y.o.   MRN: [id number]  HPI  Patient is a 81 year old male who presents to the clinic for one-month follow-up on hypertension and dizziness. He reports that he is feeling much better after stopping metoprolol. He denies any dizziness, tachycardia, headaches, chest pain or palpations. He has been taking his blood pressure medicine daily but he has not taken it this morning.  He did start his Pravachol and taking his cholesterol medication daily. He denies any side effects.    Review of Systems  All other systems reviewed and are negative.      Objective:   Physical Exam  Constitutional: He is oriented to person, place, and time. He appears well-developed and well-nourished.  HENT:  Head: Normocephalic and atraumatic.  Cardiovascular: Normal rate, regular rhythm and normal heart sounds.   Pulmonary/Chest: Effort normal and breath sounds normal.  Neurological: He is alert and oriented to person, place, and time.  Psychiatric: He has a normal mood and affect. His behavior is normal.          Assessment & Plan:  Hypertension-patient did not take his medication today. His blood pressure still appears high. He assures me he will check it at a pharmacy and make sure running under 140/90. I discussed with him to call in these numbers. Recheck nurse visit 2 weeks blood pressure after taking his medication. I did send refills of lisinopril/HCTZ to the pharmacy.  Dizziness-I do think dizziness is coming from metoprolol and perhaps too low of a pulse. This has resolved.  Hyperlipidemia-continue on Pravachol daily.

## 2015-03-01 ENCOUNTER — Ambulatory Visit (INDEPENDENT_AMBULATORY_CARE_PROVIDER_SITE_OTHER): Payer: Medicare Other | Admitting: Physician Assistant

## 2015-03-01 ENCOUNTER — Encounter: Payer: Self-pay | Admitting: Physician Assistant

## 2015-03-01 VITALS — BP 141/50 | HR 59 | Ht 65.0 in | Wt 136.0 lb

## 2015-03-01 DIAGNOSIS — I10 Essential (primary) hypertension: Secondary | ICD-10-CM

## 2015-03-01 DIAGNOSIS — F419 Anxiety disorder, unspecified: Secondary | ICD-10-CM

## 2015-03-01 DIAGNOSIS — F329 Major depressive disorder, single episode, unspecified: Secondary | ICD-10-CM

## 2015-03-01 DIAGNOSIS — F32A Depression, unspecified: Secondary | ICD-10-CM | POA: Insufficient documentation

## 2015-03-01 MED ORDER — FLUOXETINE HCL 20 MG PO TABS: 20.0000 mg | ORAL_TABLET | Freq: Every day | ORAL | Status: DC

## 2015-03-01 NOTE — Patient Instructions (Signed)
Melatonin 3mg  up to 10mg  1 hour before bedtime.

## 2015-03-01 NOTE — Progress Notes (Signed)
   Subjective:    Patient ID: Gregory Cuevas, male    DOB: 05-01-1933, 82 y.o.   MRN: [id number]  HPI  Pt presents to the clinic with new anxiety and depression for last year. He has never been on anything for his mood.  His wife is with him and state symptoms started after his son discussed he was getting a divorce and his wife was bipolar. He feels like he is always worrying. He is down and has no energy. He doesn't want to do anything and very irritable. He thinks about how now he understands why people end their life. He has never made a plan.  He is also having some problems getting to sleep at night. Not tried anything.    Review of Systems  All other systems reviewed and are negative.      Objective:   Physical Exam  Constitutional: He is oriented to person, place, and time. He appears well-developed and well-nourished.  HENT:  Head: Normocephalic and atraumatic.  Cardiovascular: Normal rate, regular rhythm and normal heart sounds.   Pulmonary/Chest: Effort normal and breath sounds normal.  Neurological: He is alert and oriented to person, place, and time.  Psychiatric: He has a normal mood and affect. His behavior is normal.          Assessment & Plan:  Acute depression/anxiety- PHQ-9 was 17. GAD-7 was 18. Started prozac 1/2 tablet for 7 days then increase to 1 full tablet. Discussed side effects. Follow up in 4-6 weeks. Consider exercise and meditation to help get through this stressful time. Melatonin encouraged for sleep.    HTN/cardiomegaly- under 150/90 today which is controlled for patients age. I do not want to add any other blood pressure medication due to potential side effects. Would consider BB for cardiomegaly but concerned due to low HR.  Continue zestoretic.

## 2015-03-17 ENCOUNTER — Encounter: Payer: Self-pay | Admitting: Physician Assistant

## 2015-03-17 ENCOUNTER — Ambulatory Visit (INDEPENDENT_AMBULATORY_CARE_PROVIDER_SITE_OTHER): Payer: Medicare Other | Admitting: Physician Assistant

## 2015-03-17 VITALS — BP 153/62 | HR 68 | Ht 65.0 in | Wt 130.0 lb

## 2015-03-17 DIAGNOSIS — R351 Nocturia: Secondary | ICD-10-CM

## 2015-03-17 DIAGNOSIS — R300 Dysuria: Secondary | ICD-10-CM

## 2015-03-17 DIAGNOSIS — K5900 Constipation, unspecified: Secondary | ICD-10-CM | POA: Diagnosis not present

## 2015-03-17 DIAGNOSIS — N410 Acute prostatitis: Secondary | ICD-10-CM

## 2015-03-17 LAB — POCT URINALYSIS DIPSTICK
Blood, UA: NEGATIVE
GLUCOSE UA: NEGATIVE
Ketones, UA: 15
Leukocytes, UA: NEGATIVE
Nitrite, UA: NEGATIVE
PROTEIN UA: 30
SPEC GRAV UA: 1.02
UROBILINOGEN UA: 2
pH, UA: 7

## 2015-03-17 MED ORDER — CIPROFLOXACIN HCL 500 MG PO TABS: 500.0000 mg | ORAL_TABLET | Freq: Two times a day (BID) | ORAL | Status: DC

## 2015-03-17 MED ORDER — POLYETHYLENE GLYCOL 3350 17 G PO PACK: 17.0000 g | PACK | Freq: Two times a day (BID) | ORAL | Status: DC

## 2015-03-17 NOTE — Patient Instructions (Signed)

## 2015-03-17 NOTE — Progress Notes (Signed)
   Subjective:    Patient ID: Gregory Cuevas, male    DOB: 1933/09/06, 82 y.o.   MRN: [id number]  HPI  Pt is a 82 yo male who presents to the clinic with dysuria, constipation and frequent urination for the last week that seems to be worsening. He has previously had some urine symptoms but not this bad. Pt was recently started on prozac for acute depression symptoms. He first noticed he was having problems having a bowel movement. He would go every 3-4 days but still hard stools with straining. Then about 2-3 days ago pt experienced difficultly urinating and going to the bathroom(urinating) multple times a day and 6 times a night. Trouble with weak stream. Denies any fever right now. He does feel hot and clamy. No n/v. No body aches. Stopped prozac caused he fills medication started symptoms.     Review of Systems    see HPI.  Objective:   Physical Exam  Constitutional: He is oriented to person, place, and time. He appears well-developed and well-nourished.  HENT:  Head: Normocephalic and atraumatic.  Cardiovascular: Normal rate, regular rhythm and normal heart sounds.   Pulmonary/Chest: Effort normal and breath sounds normal.  No CVA tenderness.   Abdominal: Soft. Bowel sounds are normal. He exhibits no distension and no mass. There is no tenderness. There is no rebound and no guarding.  Genitourinary: Penis normal. No penile tenderness.  No testicular tenderness.  DRE tender/soft and enlarged without mass.   Neurological: He is alert and oriented to person, place, and time.  Psychiatric: He has a normal mood and affect. His behavior is normal.          Assessment & Plan:  Acute prostatitis- AUA was 35. .. Results for orders placed or performed in visit on 03/17/15  POCT urinalysis dipstick  Result Value Ref Range   Color, UA amber    Clarity, UA clear    Glucose, UA neg    Bilirubin, UA small    Ketones, UA 15    Spec Grav, UA 1.020    Blood, UA neg    pH, UA 7.0    Protein, UA  30    Urobilinogen, UA 2.0    Nitrite, UA neg    Leukocytes, UA Negative Negative   I think symptoms represent acute prostatitis. DRE exam found enlarged prostate and tender and soft. Started on cipro for 4 weeks. When completely note since pt has had symptoms before this urgent situation will start flomax daily. Follow up in 3 weeks or if symptoms worsening.   Constipation- sent miralax twice daily until starts having regular bowel movements then go to once a day then as needed. Stop prozac for now and can reconsider options for depression at later date.

## 2015-03-18 ENCOUNTER — Telehealth: Payer: Self-pay | Admitting: Physician Assistant

## 2015-03-18 DIAGNOSIS — K5900 Constipation, unspecified: Secondary | ICD-10-CM | POA: Insufficient documentation

## 2015-03-18 DIAGNOSIS — N410 Acute prostatitis: Secondary | ICD-10-CM | POA: Insufficient documentation

## 2015-03-18 DIAGNOSIS — R351 Nocturia: Secondary | ICD-10-CM | POA: Insufficient documentation

## 2015-03-18 MED ORDER — TAMSULOSIN HCL 0.4 MG PO CAPS: 0.4000 mg | ORAL_CAPSULE | Freq: Every day | ORAL | Status: DC

## 2015-03-18 NOTE — Telephone Encounter (Signed)
Patient states his urinary symptoms are better. He denies any constipation. Patient advised of recommendations.

## 2015-03-18 NOTE — Telephone Encounter (Signed)
Find out if urinating better and having any bowel movements?  Let pt know,  I decided I would like to add flomax as well to cipro this can help with flow a little more since he noted he was having symptoms but much more mild. I will send to pharmacy. Take once a day.

## 2015-03-18 NOTE — Telephone Encounter (Signed)
Find out about urinary symptoms and constipation?  I also added flomax once a day. Since he was having prostate issues before this exacerbation I think this will also help him urinate better and symptoms improve faster.  Take with abx cipro.  Follow up in 3 weeks or sooner if symptoms are unchanged or worsen.

## 2015-03-19 LAB — URINE CULTURE
COLONY COUNT: NO GROWTH
Organism ID, Bacteria: NO GROWTH

## 2015-03-30 ENCOUNTER — Telehealth: Payer: Self-pay | Admitting: Physician Assistant

## 2015-03-30 NOTE — Telephone Encounter (Signed)
error 

## 2015-04-14 ENCOUNTER — Ambulatory Visit (INDEPENDENT_AMBULATORY_CARE_PROVIDER_SITE_OTHER): Payer: Medicare Other | Admitting: Physician Assistant

## 2015-04-14 ENCOUNTER — Encounter: Payer: Self-pay | Admitting: Physician Assistant

## 2015-04-14 VITALS — BP 125/53 | HR 69 | Ht 65.0 in | Wt 134.0 lb

## 2015-04-14 DIAGNOSIS — K5900 Constipation, unspecified: Secondary | ICD-10-CM | POA: Diagnosis not present

## 2015-04-14 DIAGNOSIS — N410 Acute prostatitis: Secondary | ICD-10-CM | POA: Diagnosis not present

## 2015-04-14 NOTE — Progress Notes (Signed)
   Subjective:    Patient ID: Gregory Cuevas, male    DOB: [birth year], 82 y.o.   MRN: [id number]  HPI  Pt presents to the clinic for 1 month follow up after acute prostatitis. He completed cipro for one month. He did not start flomax until he completed cipro. Symptoms had resolved before starting flomax. He is doing great with no concerns or complaints. He sleeps through the whole night. He is having better BM daily that are soft and full. miralax as needed.    Review of Systems  All other systems reviewed and are negative.      Objective:   Physical Exam  Constitutional: He appears well-developed and well-nourished.  HENT:  Head: Normocephalic and atraumatic.  Cardiovascular: Normal rate, regular rhythm and normal heart sounds.   Pulmonary/Chest: Effort normal and breath sounds normal.  Psychiatric: He has a normal mood and affect. His behavior is normal.          Assessment & Plan:  Acute prostatitis- symptoms resolved before starting flomax. I'm ok with stopping flomax. AUA decreased from 35 to 2.  Follow up if symptoms reoccur.   constipation controlled with miralax as needed.

## 2015-05-06 ENCOUNTER — Other Ambulatory Visit: Payer: Self-pay | Admitting: *Deleted

## 2015-05-06 MED ORDER — PRAVASTATIN SODIUM 40 MG PO TABS: 40.0000 mg | ORAL_TABLET | Freq: Every day | ORAL | Status: DC

## 2015-05-18 ENCOUNTER — Ambulatory Visit (INDEPENDENT_AMBULATORY_CARE_PROVIDER_SITE_OTHER): Payer: Medicare Other

## 2015-05-18 ENCOUNTER — Ambulatory Visit (INDEPENDENT_AMBULATORY_CARE_PROVIDER_SITE_OTHER): Payer: Medicare Other | Admitting: Physician Assistant

## 2015-05-18 ENCOUNTER — Encounter: Payer: Self-pay | Admitting: Physician Assistant

## 2015-05-18 VITALS — BP 132/72 | HR 58 | Ht 65.0 in | Wt 132.0 lb

## 2015-05-18 DIAGNOSIS — Z1211 Encounter for screening for malignant neoplasm of colon: Secondary | ICD-10-CM | POA: Diagnosis not present

## 2015-05-18 DIAGNOSIS — C189 Malignant neoplasm of colon, unspecified: Secondary | ICD-10-CM

## 2015-05-18 DIAGNOSIS — K5900 Constipation, unspecified: Secondary | ICD-10-CM

## 2015-05-18 DIAGNOSIS — I517 Cardiomegaly: Secondary | ICD-10-CM | POA: Diagnosis not present

## 2015-05-18 MED ORDER — LINACLOTIDE 145 MCG PO CAPS: 145.0000 ug | ORAL_CAPSULE | Freq: Every day | ORAL | Status: DC

## 2015-05-18 NOTE — Addendum Note (Signed)
Addended by: Donella Stade on: 05/18/2015 11:07 AM   Modules accepted: Orders

## 2015-05-18 NOTE — Progress Notes (Addendum)
   Subjective:    Patient ID: Gregory Cuevas, male    DOB: [birth year], 82 y.o.   MRN: [id number]  HPI Patient is here today complaining of constipation for the past two weeks. Last week, he went 5 days without having a bowel movement. He took Miralax and did a rectal suppository, and then had a small bowel movement 05/16/15. He reports this bowel movement was a normal color, but a little more watery/loose than usual. He has not had a bowel movement since Sunday 05/16/15. He has not had any Miralax, Magnesium, or suppositories since 05/16/15. He reports a good appetite. He denies stomach aches, vomiting, acid reflux. He denies recent unexpected weight loss, fevers, chills, night sweats.   He states that his diet is high in fiber. He has not had a problem with constipation before now. He did have colon cancer approximately 20 years ago and had a section of his bowel removed. Last colonoscopy was done in 2012 suggest 5 year follow up.    Review of Systems     Objective:   Physical Exam  Constitutional: He appears well-developed and well-nourished.  HENT:  Head: Normocephalic and atraumatic.  Cardiovascular: Normal rate, regular rhythm and normal heart sounds.   Pulmonary/Chest: Effort normal and breath sounds normal.  Abdominal: Bowel sounds are normal. He exhibits distension. He exhibits no mass. There is no tenderness. There is no rebound and no guarding.  Skin: Skin is warm and dry.  Psychiatric: He has a normal mood and affect. His behavior is normal. Judgment normal.          Assessment & Plan:  1. Constipation- Patient is here complaining of constipation for the past 7 days. He has had 1 bowel movement despite taking Miralax and using rectal suppositories. He did only try miralax for one day. Discussed may have needed more time.I am going to order an abdominal X-ray to rule out a bowel obstruction before prescribing medication for constipation. If X-ray is normal, will start Linzess.  He does  have a history of colon cancer and is due for his colonoscopy this year. He would like to be referred to digestive health since in [place]. Last colonoscopy done at [facility].

## 2015-06-09 ENCOUNTER — Other Ambulatory Visit: Payer: Self-pay | Admitting: Physician Assistant

## 2015-08-04 ENCOUNTER — Encounter: Payer: Self-pay | Admitting: Physician Assistant

## 2015-08-04 DIAGNOSIS — D126 Benign neoplasm of colon, unspecified: Secondary | ICD-10-CM | POA: Insufficient documentation

## 2015-09-19 ENCOUNTER — Other Ambulatory Visit: Payer: Self-pay | Admitting: Physician Assistant

## 2015-11-08 ENCOUNTER — Other Ambulatory Visit: Payer: Self-pay | Admitting: Physician Assistant

## 2015-11-15 ENCOUNTER — Other Ambulatory Visit: Payer: Self-pay | Admitting: Physician Assistant

## 2015-12-24 ENCOUNTER — Other Ambulatory Visit: Payer: Self-pay | Admitting: Physician Assistant

## 2016-02-22 ENCOUNTER — Other Ambulatory Visit: Payer: Self-pay | Admitting: Physician Assistant

## 2016-06-21 ENCOUNTER — Ambulatory Visit (INDEPENDENT_AMBULATORY_CARE_PROVIDER_SITE_OTHER): Payer: Medicare Other | Admitting: Physician Assistant

## 2016-06-21 ENCOUNTER — Encounter: Payer: Self-pay | Admitting: Physician Assistant

## 2016-06-21 VITALS — BP 123/73 | HR 65 | Ht 65.0 in | Wt 134.0 lb

## 2016-06-21 DIAGNOSIS — E7800 Pure hypercholesterolemia, unspecified: Secondary | ICD-10-CM

## 2016-06-21 DIAGNOSIS — I10 Essential (primary) hypertension: Secondary | ICD-10-CM | POA: Diagnosis not present

## 2016-06-21 LAB — COMPLETE METABOLIC PANEL WITH GFR
ALT: 12 U/L (ref 9–46)
AST: 18 U/L (ref 10–35)
Albumin: 4.2 g/dL (ref 3.6–5.1)
Alkaline Phosphatase: 68 U/L (ref 40–115)
BILIRUBIN TOTAL: 1.2 mg/dL (ref 0.2–1.2)
BUN: 15 mg/dL (ref 7–25)
CHLORIDE: 94 mmol/L — AB (ref 98–110)
CO2: 24 mmol/L (ref 20–31)
CREATININE: 0.98 mg/dL (ref 0.70–1.11)
Calcium: 9.4 mg/dL (ref 8.6–10.3)
GFR, Est African American: 83 mL/min (ref >=60)
GFR, Est Non African American: 72 mL/min (ref >=60)
Glucose, Bld: 89 mg/dL (ref 65–99)
Potassium: 4.6 mmol/L (ref 3.5–5.3)
Sodium: 128 mmol/L — ABNORMAL LOW (ref 135–146)
Total Protein: 6.7 g/dL (ref 6.1–8.1)

## 2016-06-21 LAB — LIPID PANEL W/REFLEX DIRECT LDL
Cholesterol: 235 mg/dL — ABNORMAL HIGH (ref <200)
HDL: 92 mg/dL (ref >40)
LDL-CHOLESTEROL: 124 mg/dL — AB
Non-HDL Cholesterol (Calc): 143 mg/dL — ABNORMAL HIGH (ref <130)
Total CHOL/HDL Ratio: 2.6 Ratio (ref <5.0)
Triglycerides: 91 mg/dL (ref <150)

## 2016-06-21 MED ORDER — PRAVASTATIN SODIUM 40 MG PO TABS: 40.0000 mg | ORAL_TABLET | Freq: Every day | ORAL | 4 refills | Status: DC

## 2016-06-21 MED ORDER — LISINOPRIL-HYDROCHLOROTHIAZIDE 20-12.5 MG PO TABS: 1.0000 | ORAL_TABLET | Freq: Every day | ORAL | 1 refills | Status: DC

## 2016-06-21 NOTE — Progress Notes (Signed)
   Subjective:    Patient ID: Gregory Cuevas, male    DOB: 1933-08-06, 83 y.o.   MRN: [id number]  HPI Pt is a 83 yo male who presents to the clinic for 6 month follow up on HTN and hyperlipidemia. He is doing great overall.   HTN- denies any CP, palpitations, headaches, vision changes. He is taking Zestoretic daily without any concerns.   Hyperlipidemia- taking pravachol daily with 81mg  baby asa. No concerns.   .. Active Ambulatory Problems    Diagnosis Date Noted  . Malignant neoplasm of colon ([facility]) 02/10/2010  . Hyperlipidemia 02/11/2010  . Cardiomegaly 01/19/2014  . Diastolic dysfunction 32/20/2542  . Mild aortic regurgitation 03/02/2014  . ASD (atrial septal defect) 03/02/2014  . Aortic insufficiency 03/02/2014  . Essential hypertension, benign 11/03/2014  . Acute depression 03/01/2015  . Acute anxiety 03/01/2015  . Constipation 03/18/2015  . Nocturia 03/18/2015  . Acute prostatitis 03/18/2015  . Adenomatous polyp of colon 08/04/2015   Resolved Ambulatory Problems    Diagnosis Date Noted  . Essential hypertension, benign 02/10/2010  . DARK URINE 02/10/2010  . Rhinorrhea 01/26/2014  . Nasal congestion 01/26/2014   Past Medical History:  Diagnosis Date  . AMI (acute myocardial infarction) ([facility]) 1980  . Colon cancer ([place]) 1990's  . Detached retina       Review of Systems  All other systems reviewed and are negative.      Objective:   Physical Exam  Constitutional: He is oriented to person, place, and time. He appears well-developed and well-nourished.  HENT:  Head: Normocephalic and atraumatic.  Cardiovascular: Normal rate, regular rhythm and normal heart sounds.   Pulmonary/Chest: Effort normal and breath sounds normal.  Neurological: He is alert and oriented to person, place, and time.  Psychiatric: He has a normal mood and affect. His behavior is normal.          Assessment & Plan:  Marland KitchenMarland KitchenQuashon was seen today for hypertension and hyperlipidemia.  Diagnoses  and all orders for this visit:  Essential hypertension, benign -     lisinopril-hydrochlorothiazide (PRINZIDE,ZESTORETIC) 20-12.5 MG tablet; Take 1 tablet by mouth daily. -     COMPLETE METABOLIC PANEL WITH GFR  Pure hypercholesterolemia -     pravastatin (PRAVACHOL) 40 MG tablet; Take 1 tablet (40 mg total) by mouth daily. -     Lipid Panel w/reflex Direct LDL   Labs ordered today. Follow up in 6 months.

## 2016-06-22 ENCOUNTER — Encounter: Payer: Self-pay | Admitting: Physician Assistant

## 2016-06-22 DIAGNOSIS — E871 Hypo-osmolality and hyponatremia: Secondary | ICD-10-CM | POA: Insufficient documentation

## 2016-06-23 ENCOUNTER — Other Ambulatory Visit: Payer: Self-pay

## 2016-06-23 MED ORDER — LISINOPRIL 20 MG PO TABS: 20.0000 mg | ORAL_TABLET | Freq: Every day | ORAL | 0 refills | Status: DC

## 2016-07-07 ENCOUNTER — Ambulatory Visit (INDEPENDENT_AMBULATORY_CARE_PROVIDER_SITE_OTHER): Payer: Medicare Other | Admitting: Family Medicine

## 2016-07-07 VITALS — BP 135/56 | HR 62 | Wt 136.0 lb

## 2016-07-07 DIAGNOSIS — I10 Essential (primary) hypertension: Secondary | ICD-10-CM

## 2016-07-07 MED ORDER — LISINOPRIL 20 MG PO TABS: 20.0000 mg | ORAL_TABLET | Freq: Every day | ORAL | 1 refills | Status: DC

## 2016-07-07 NOTE — Progress Notes (Signed)
Pt here for BP check since starting new medication Lisinopril 20 mg. I asked how he felt he was doing on new med he states ok. He has not checked his bp today.   Pt's initial bp was elevated. I had pt to sit for a little while before rechecking his bp again. The second bp did come down. Pt advised to keep a log of his blood pressures and to call the clinic if he has any concerns with elevated bp readings. Pt voiced understanding and agreed. He will f/u with pcp in 5 months, refills given.Audelia Hives Gardner

## 2016-07-07 NOTE — Patient Instructions (Signed)
Please keep a log of your blood pressure readings and bring those in with you to your next appointment.

## 2016-07-07 NOTE — Progress Notes (Signed)
Vitals:   07/07/16 0929 07/07/16 0940  BP: (!) 156/62 (!) 135/56  Pulse: 62

## 2016-07-21 ENCOUNTER — Ambulatory Visit (INDEPENDENT_AMBULATORY_CARE_PROVIDER_SITE_OTHER): Payer: Medicare Other

## 2016-07-21 ENCOUNTER — Encounter: Payer: Self-pay | Admitting: Physician Assistant

## 2016-07-21 ENCOUNTER — Ambulatory Visit (INDEPENDENT_AMBULATORY_CARE_PROVIDER_SITE_OTHER): Payer: Medicare Other | Admitting: Physician Assistant

## 2016-07-21 VITALS — BP 154/67 | HR 60 | Wt 134.0 lb

## 2016-07-21 DIAGNOSIS — S92425A Nondisplaced fracture of distal phalanx of left great toe, initial encounter for closed fracture: Secondary | ICD-10-CM

## 2016-07-21 DIAGNOSIS — S99922A Unspecified injury of left foot, initial encounter: Secondary | ICD-10-CM

## 2016-07-21 DIAGNOSIS — W2201XA Walked into wall, initial encounter: Secondary | ICD-10-CM | POA: Diagnosis not present

## 2016-07-21 MED ORDER — NAPROXEN 500 MG PO TABS: 500.0000 mg | ORAL_TABLET | Freq: Two times a day (BID) | ORAL | 0 refills | Status: DC

## 2016-07-21 NOTE — Patient Instructions (Signed)
-   Plan to go downstairs for X-ray today - Take Naprosyn twice a day as needed for pain - Apply ice x 15 minutes, three times daily - Elevate left leg - Follow-up with Sports Medicine next week   RICE for Routine Care of Injuries Many injuries can be cared for using rest, ice, compression, and elevation (RICE therapy). Using RICE therapy can help to lessen pain and swelling. It can help your body to heal. Rest Reduce your normal activities and avoid using the injured part of your body. You can go back to your normal activities when you feel okay and your doctor says it is okay. Ice Do not put ice on your bare skin.  Put ice in a plastic bag.  Place a towel between your skin and the bag.  Leave the ice on for 20 minutes, 2-3 times a day.  Do this for as long as told by your doctor. Compression Compression means putting pressure on the injured area. This can be done with an elastic bandage. If an elastic bandage has been applied:  Remove and reapply the bandage every 3-4 hours or as told by your doctor.  Make sure the bandage is not wrapped too tight. Wrap the bandage more loosely if part of your body beyond the bandage is blue, swollen, cold, painful, or loses feeling (numb).  See your doctor if the bandage seems to make your problems worse.  Elevation Elevation means keeping the injured area raised. Raise the injured area above your heart or the center of your chest if you can. When should I get help? You should get help if:  You keep having pain and swelling.  Your symptoms get worse.  Get help right away if: You should get help right away if:  You have sudden bad pain at or below the area of your injury.  You have redness or more swelling around your injury.  You have tingling or numbness at or below the injury that does not go away when you take off the bandage.  This information is not intended to replace advice given to you by your health care provider. Make sure  you discuss any questions you have with your health care provider. Document Released: 06/21/2007 Document Revised: 11/30/2015 Document Reviewed: 12/10/2013 Elsevier Interactive Patient Education  2017 [employer].

## 2016-07-21 NOTE — Progress Notes (Signed)
HPI:                                                                Gregory Cuevas is a 83 y.o. male who presents to [facility] today for toe pain  Patient reports jamming his left great toe 1 month ago against a staircase. Since that time he has noted swelling and discoloration of the toe. He reports intermittent dull pain. He has been able to bear weight and ambulate normally.   Past Medical History:  Diagnosis Date  . AMI (acute myocardial infarction) ([facility]) 1980  . Colon cancer ([facility]) 1990's  . Detached retina    Past Surgical History:  Procedure Laterality Date  . EYE SURGERY    . PARTIAL COLECTOMY     Social History  Substance Use Topics  . Smoking status: Never Smoker  . Smokeless tobacco: Never Used  . Alcohol use No   family history includes Cancer in his brother and father; Liver cancer in his father.  ROS: negative except as noted in the HPI  Medications: Current Outpatient Prescriptions  Medication Sig Dispense Refill  . aspirin 81 MG tablet Take 81 mg by mouth daily.    [name] lisinopril (PRINIVIL,ZESTRIL) 20 MG tablet Take 1 tablet (20 mg total) by mouth daily. 90 tablet 1  . pravastatin (PRAVACHOL) 40 MG tablet Take 1 tablet (40 mg total) by mouth daily. 90 tablet 4  . naproxen (NAPROSYN) 500 MG tablet Take 1 tablet (500 mg total) by mouth 2 (two) times daily with a meal. 30 tablet 0   No current facility-administered medications for this visit.    No Known Allergies     Objective:  BP (!) 154/67   Pulse 60   Wt 134 lb (60.8 kg)   BMI 22.30 kg/m  Gen: well-groomed, cooperative, not ill-appearing, no distress Pulm: Normal work of breathing, normal phonation CV: DP and PT pulses intact Neuro: normal tone, sensation grossly intact MSK: left great toe slightly swollen compared to right, there is discoloration/hyperpigmentation at the DIP joint, patient is tender medially at the base of the DIP, able to flex and  extend without pain, remainder of the left foot atraumatic and nontender;  normal gait and station, no peripheral edema  No results found for this or any previous visit (from the past 72 hour(s)). No results found.    Assessment and Plan: 83 y.o. male with   1. Injury of left great toe, initial encounter - DG Foot Complete Left; Future - RICE protocol - naproxen (NAPROSYN) 500 MG tablet; Take 1 tablet (500 mg total) by mouth 2 (two) times daily with a meal.  Dispense: 30 tablet; Refill: 0  Patient education and anticipatory guidance given Patient agrees with treatment plan Follow-up within 1 week with Sports Medicine or sooner as needed if symptoms worsen or fail to improve  Darlyne Russian PA-C

## 2016-07-22 NOTE — Progress Notes (Signed)
X-ray shows a nondisplaced fracture of the great toe. Recommend patient come in to see Sports Medicine (Dr. Darene Lamer or Dr. Georgina Snell) one day this week for follow-up

## 2016-07-25 ENCOUNTER — Encounter: Payer: Self-pay | Admitting: Family Medicine

## 2016-07-25 ENCOUNTER — Ambulatory Visit (INDEPENDENT_AMBULATORY_CARE_PROVIDER_SITE_OTHER): Payer: Medicare Other | Admitting: Family Medicine

## 2016-07-25 DIAGNOSIS — S92424A Nondisplaced fracture of distal phalanx of right great toe, initial encounter for closed fracture: Secondary | ICD-10-CM | POA: Diagnosis not present

## 2016-07-25 DIAGNOSIS — S92401A Displaced unspecified fracture of right great toe, initial encounter for closed fracture: Secondary | ICD-10-CM | POA: Insufficient documentation

## 2016-07-25 NOTE — Patient Instructions (Signed)
Thank you for coming in today. Take naproxen for pain as needed.  Do not take naproxen if you do not need it.  Recheck toe in 1 month.  Return sooner if needed.

## 2016-07-25 NOTE — Progress Notes (Signed)
   Gregory Cuevas is a 83 y.o. male who presents to [facility] today for toe fracture. Patient was seen by his primary care provider on lisinopril for a left foot injury. He accidentally stubbed his left toe. He was diagnosed with a small fracture at the distal phalanx of the left great toe. He notes with intermittent naproxen the pain is spray well controlled. He notes he has some bruising that his toe that he would like to have evaluated. Symptoms are mild.   Past Medical History:  Diagnosis Date  . AMI (acute myocardial infarction) ([facility]) 1980  . Colon cancer ([facility]) 1990's  . Detached retina    Past Surgical History:  Procedure Laterality Date  . EYE SURGERY    . PARTIAL COLECTOMY     Social History  Substance Use Topics  . Smoking status: Never Smoker  . Smokeless tobacco: Never Used  . Alcohol use No     ROS:  As above   Medications: Current Outpatient Prescriptions  Medication Sig Dispense Refill  . aspirin 81 MG tablet Take 81 mg by mouth daily.    [name] lisinopril (PRINIVIL,ZESTRIL) 20 MG tablet Take 1 tablet (20 mg total) by mouth daily. 90 tablet 1  . naproxen (NAPROSYN) 500 MG tablet Take 1 tablet (500 mg total) by mouth 2 (two) times daily with a meal. 30 tablet 0  . pravastatin (PRAVACHOL) 40 MG tablet Take 1 tablet (40 mg total) by mouth daily. 90 tablet 4   No current facility-administered medications for this visit.    No Known Allergies   Exam:  BP 138/65   Pulse 64   Wt 135 lb (61.2 kg)   SpO2 99%   BMI 22.47 kg/m  General: Well Developed, well nourished, and in no acute distress.  Neuro/Psych: Alert and oriented x3, extra-ocular muscles intact, able to move all 4 extremities, sensation grossly intact. Skin: Warm and dry, no rashes noted.  Respiratory: Not using accessory muscles, speaking in full sentences, trachea midline.  Cardiovascular: Pulses palpable, no extremity edema. Abdomen: Does not appear  distended. MSK: Left great toe bruising across the dorsal aspect of the interphalangeal joint. Otherwise normal appearing and nontender. Motion is intact. Sensation and capillary refill are intact.  Study Result   CLINICAL DATA:  Left great toe pain following injury 1 month ago.  EXAM: LEFT FOOT - COMPLETE 3+ VIEW  COMPARISON:  None.  FINDINGS: A nondisplaced fracture of the great toe distal phalanx is noted near the base.  No subluxation or dislocation identified.  The Lisfranc joints are unremarkable.  No suspicious focal bony lesions are noted.  IMPRESSION: Nondisplaced fracture of the great toe distal phalanx.   Electronically Signed   By: Margarette Canada M.D.   On: 07/21/2016 17:07       No results found for this or any previous visit (from the past 48 hour(s)). No results found.    Assessment and Plan: 83 y.o. male with left great toe fracture. Nondisplaced doing well. Plan for watchful waiting recheck in 1 month to assess for healing.    No orders of the defined types were placed in this encounter.  No orders of the defined types were placed in this encounter.   Discussed warning signs or symptoms. Please see discharge instructions. Patient expresses understanding.

## 2016-08-22 ENCOUNTER — Ambulatory Visit: Payer: Medicare Other | Admitting: Family Medicine

## 2016-08-22 DIAGNOSIS — Z0189 Encounter for other specified special examinations: Secondary | ICD-10-CM

## 2016-11-14 ENCOUNTER — Ambulatory Visit (INDEPENDENT_AMBULATORY_CARE_PROVIDER_SITE_OTHER): Payer: Medicare Other | Admitting: Physician Assistant

## 2016-11-14 ENCOUNTER — Encounter: Payer: Self-pay | Admitting: Physician Assistant

## 2016-11-14 VITALS — BP 172/67 | HR 51 | Ht 65.0 in | Wt 131.0 lb

## 2016-11-14 DIAGNOSIS — Z0000 Encounter for general adult medical examination without abnormal findings: Secondary | ICD-10-CM

## 2016-11-14 DIAGNOSIS — H5440 Blindness, one eye, unspecified eye: Secondary | ICD-10-CM | POA: Insufficient documentation

## 2016-11-14 DIAGNOSIS — Z23 Encounter for immunization: Secondary | ICD-10-CM | POA: Diagnosis not present

## 2016-11-14 DIAGNOSIS — H539 Unspecified visual disturbance: Secondary | ICD-10-CM | POA: Diagnosis not present

## 2016-11-14 DIAGNOSIS — I10 Essential (primary) hypertension: Secondary | ICD-10-CM

## 2016-11-14 DIAGNOSIS — I351 Nonrheumatic aortic (valve) insufficiency: Secondary | ICD-10-CM | POA: Diagnosis not present

## 2016-11-14 IMAGING — CR DG CHEST 2V
2 series · 2 of 2 positions shown · non-contrast
Comparison: 11/18/2009 dating back to 03/21/2005.

CLINICAL DATA: Two week history of cough and chest congestion.

EXAM:
CHEST  2 VIEW

[view not recorded (1 of 2)]
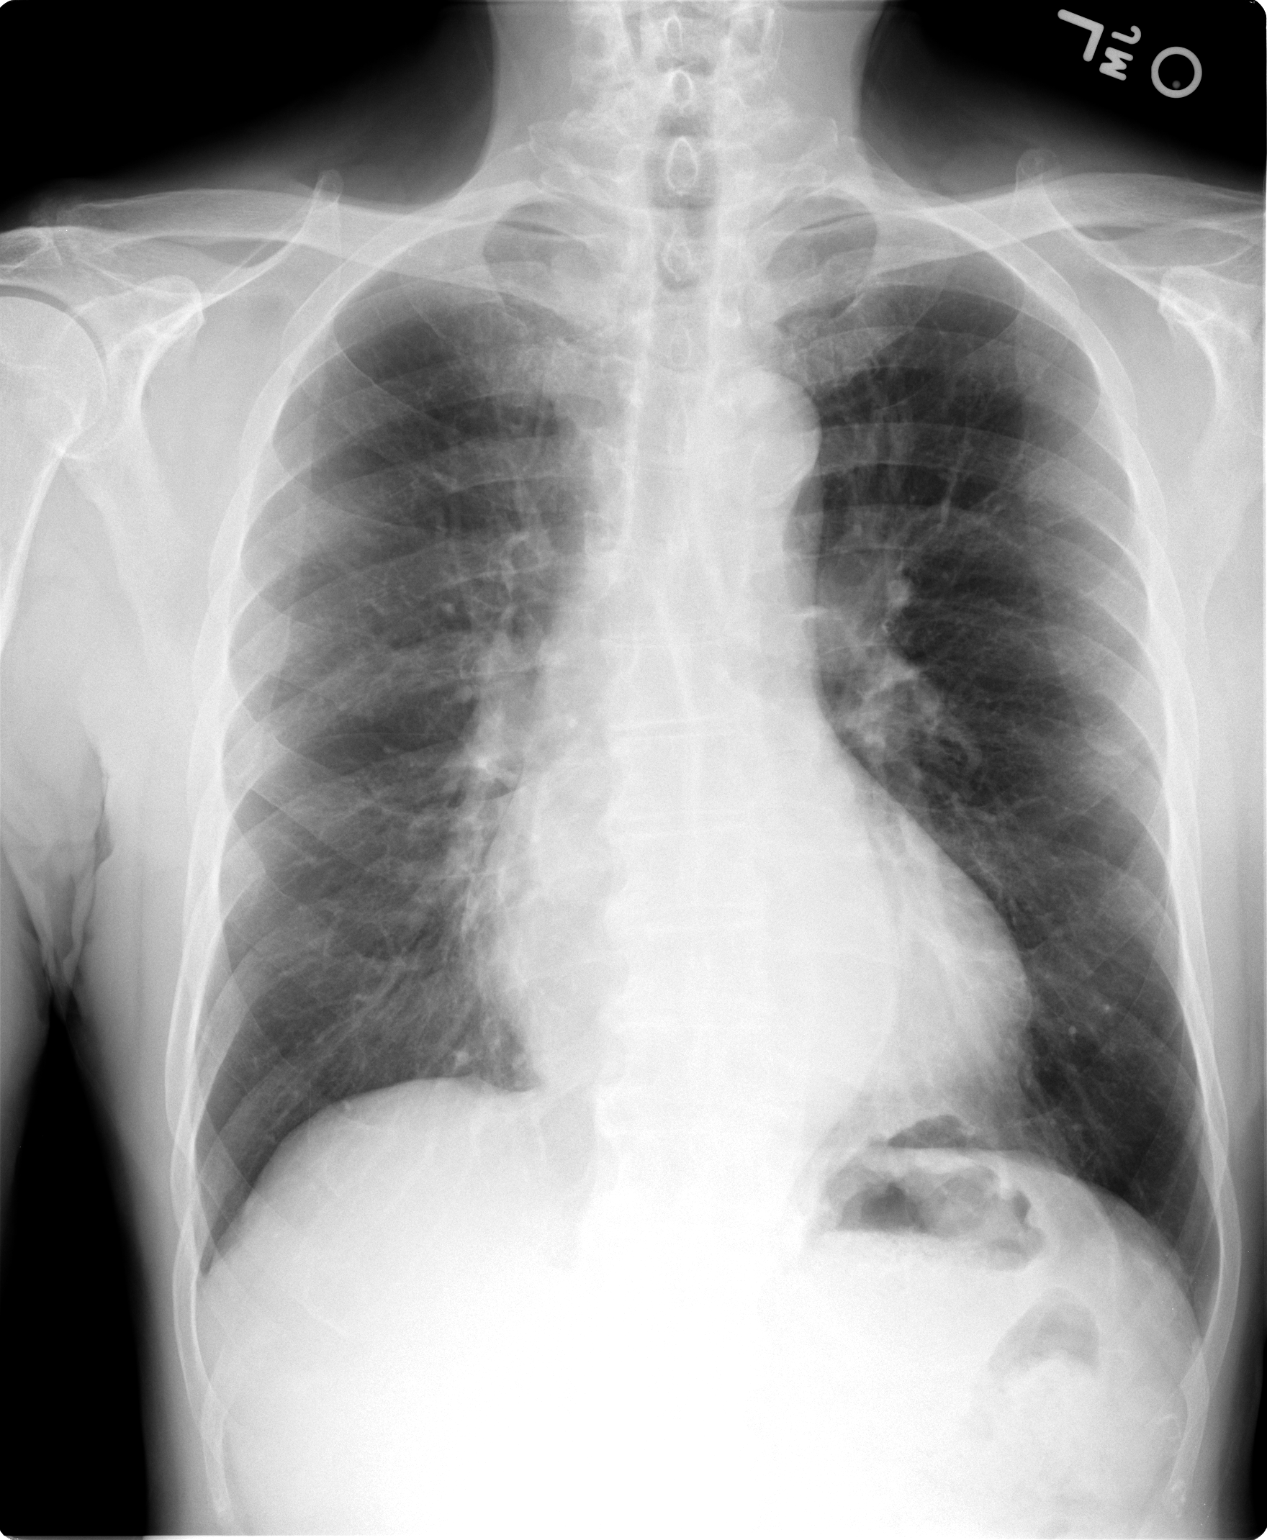

[view not recorded (2 of 2)]
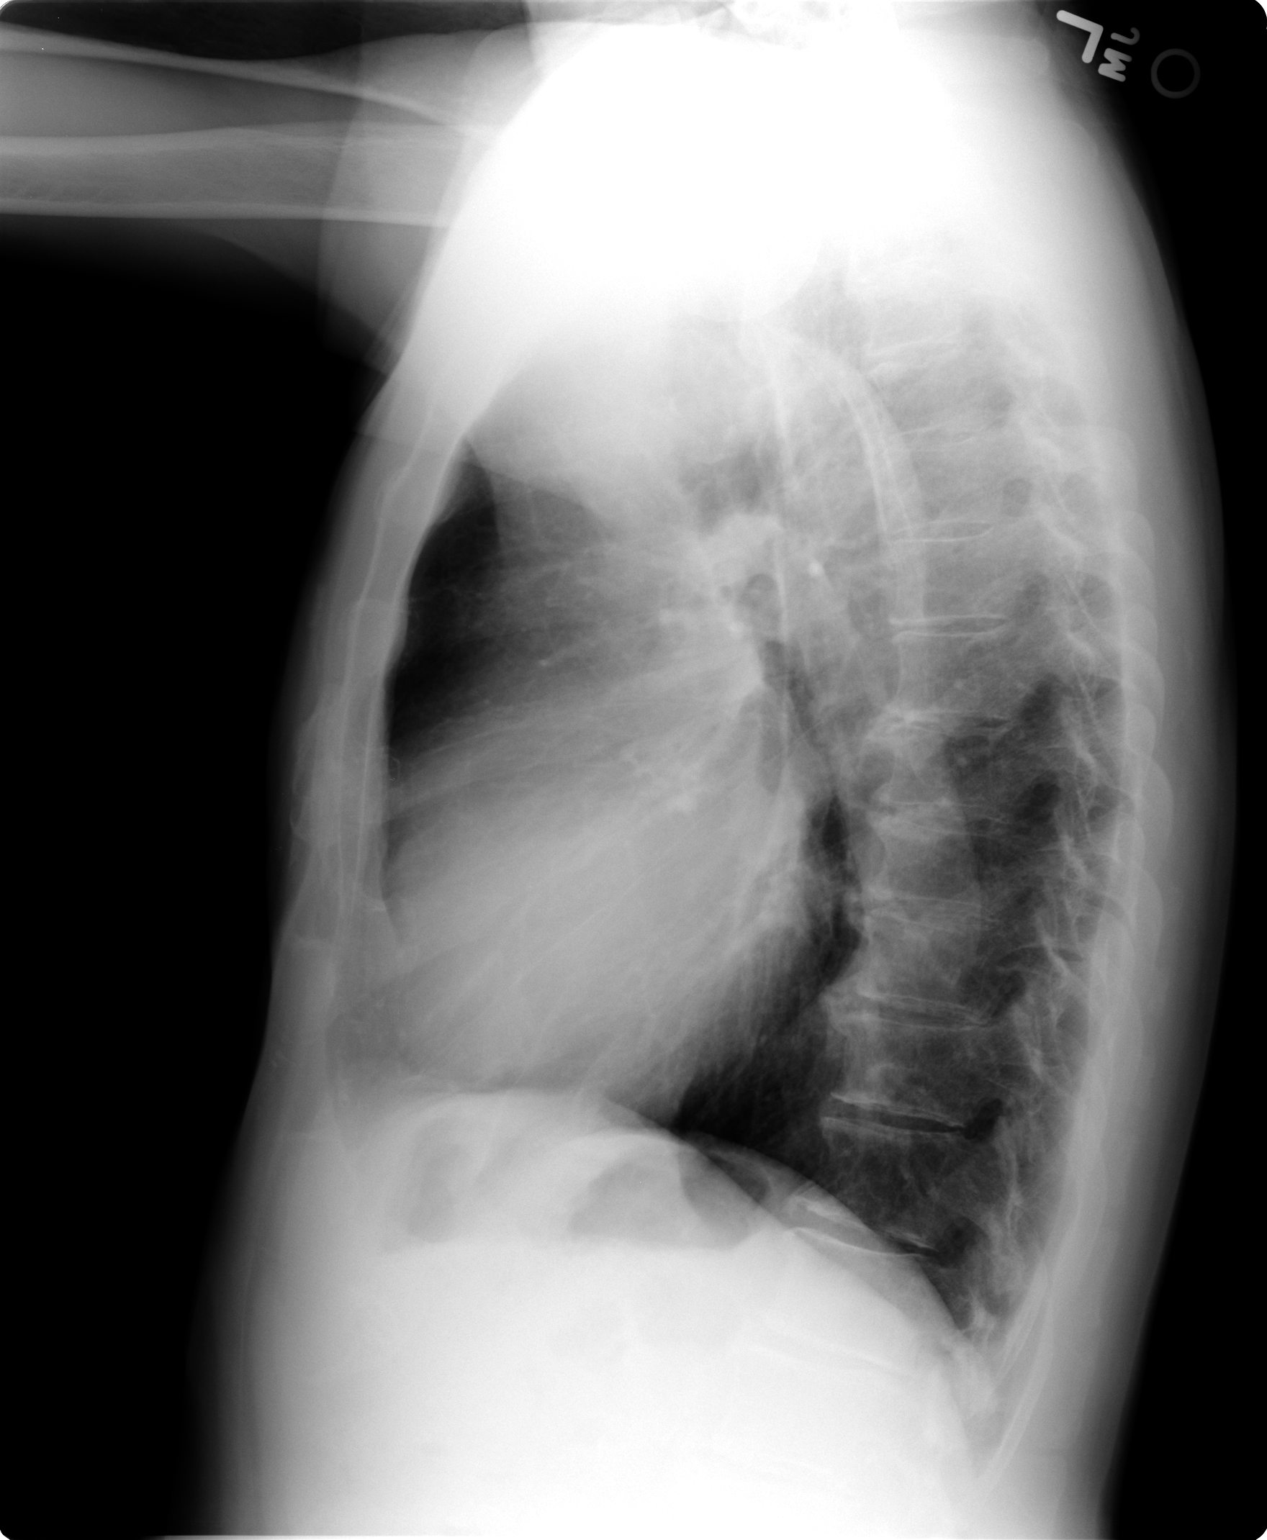

[2 of 2 positions shown; findings below may reference images not displayed]

FINDINGS: Cardiac silhouette mildly to moderately enlarged, increased in size
since 7322. Thoracic aorta tortuous and atherosclerotic, unchanged.
Hilar and mediastinal contours otherwise unremarkable. Lungs clear.
Bronchovascular markings normal. Pulmonary vascularity normal. No
visible pleural effusions. No pneumothorax. Degenerative changes
involving the thoracic spine.
IMPRESSION: 1. Mild to moderate cardiomegaly with interval increase in heart
size since [DATE].  No acute cardiopulmonary disease.

## 2016-11-14 NOTE — Patient Instructions (Addendum)
Make appt for eye doctor for annual recheck.  Consider shingrix vaccine.

## 2016-11-14 NOTE — Progress Notes (Signed)
Subjective:   Gregory Cuevas is a 83 y.o. male who presents for Medicare Annual/Subsequent preventive examination.  Review of Systems:  Pt denies any problems or concerns other than right eye some intermittent "fogginess". He is blind in left eye.        Objective:    Vitals: BP (!) 172/67   Pulse (!) 51   Ht 5\' 5"  (1.651 m)   Wt 131 lb (59.4 kg)   BMI 21.80 kg/m   Body mass index is 21.8 kg/m.  Heart: 3/6 SEM heard over left and right intercostal space. No carotid radiation.  Lungs: clear to auscultation.   Tobacco History  Smoking Status  . Never Smoker  Smokeless Tobacco  . Never Used     Counseling given: Not Answered   Past Medical History:  Diagnosis Date  . AMI (acute myocardial infarction) ([place]) 1980  . Colon cancer ([place]) 1990's  . Detached retina    Past Surgical History:  Procedure Laterality Date  . EYE SURGERY    . PARTIAL COLECTOMY     Family History  Problem Relation Age of Onset  . Liver cancer Father        deceased  . Cancer Father        liver  . Alzheimer's disease Unknown        2 sisters  . Colon cancer Unknown        2 brothers  . Esophageal cancer Unknown   . Cancer Brother        colon and esophageal   History  Sexual Activity  . Sexual activity: Not on file    Outpatient Encounter Prescriptions as of 11/14/2016  Medication Sig  . aspirin 81 MG tablet Take 81 mg by mouth daily.  [name] lisinopril (PRINIVIL,ZESTRIL) 20 MG tablet Take 1 tablet (20 mg total) by mouth daily.  . naproxen (NAPROSYN) 500 MG tablet Take 1 tablet (500 mg total) by mouth 2 (two) times daily with a meal.  . pravastatin (PRAVACHOL) 40 MG tablet Take 1 tablet (40 mg total) by mouth daily.   No facility-administered encounter medications on file as of 11/14/2016.     Activities of Daily Living In your present state of health, do you have any difficulty performing the following activities: 11/14/2016  Hearing? N  Vision? Y  Comment left blind eye. right eye  complains of being "foggy" at times.   Difficulty concentrating or making decisions? N  Walking or climbing stairs? N  Dressing or bathing? N  Doing errands, shopping? N  Some recent data might be hidden    Patient Care Team: Gregory Cuevas as PCP - General (Family Medicine)   Assessment:    Pt walks every day.  Exercise Activities and Dietary recommendations    Goals    None     Fall Risk Fall Risk  07/07/2016 04/16/2013  Falls in the past year? No No   Depression Screen PHQ 2/9 Scores 07/07/2016 04/16/2013  PHQ - 2 Score 0 0    Cognitive Function     6CIT Screen 11/14/2016  What Year? 0 points  What month? 0 points  What time? 0 points  Count back from 20 0 points  Months in reverse 2 points  Repeat phrase 2 points  Total Score 4    Immunization History  Administered Date(s) Administered  . Influenza,inj,Quad PF,6+ Mos 12/25/2012, 11/03/2014, 11/14/2016  . Pneumococcal Conjugate-13 11/14/2016  . Pneumococcal Polysaccharide-23 03/17/2010  . Td 03/17/2010  . Zoster  12/25/2012   Screening Tests Health Maintenance  Topic Date Due  . TETANUS/TDAP  03/16/2020  . INFLUENZA VACCINE  Completed  . PNA vac Low Risk Adult  Completed      Plan:     I have personally reviewed and noted the following in the patient's chart:   . Medical and social history . Use of alcohol, tobacco or illicit drugs  . Current medications and supplements . Functional ability and status . Nutritional status . Physical activity . Advanced directives . List of other physicians . Hospitalizations, surgeries, and ER visits in previous 12 months . Vitals . Screenings to include cognitive, depression, and falls . Referrals and appointments  In addition, I have reviewed and discussed with patient certain preventive protocols, quality metrics, and best practice recommendations. A written personalized care plan for preventive services as well as general preventive health  recommendations were provided to patient.   Marland KitchenMarinus Cuevas was seen today for medicare wellness.  Diagnoses and all orders for this visit:  Encounter for Medicare annual wellness exam  Need for pneumococcal vaccination -     Pneumococcal conjugate vaccine 13-valent  Influenza vaccine needed -     Flu Vaccine QUAD 6+ mos PF IM (Fluarix Quad PF)  Blind left eye -     Ambulatory referral to Ophthalmology  Vision changes -     Ambulatory referral to Ophthalmology   Labs up to date.  He did not take his BP medications today. BP elevated but states he checks at home all the time and ranges below 140/90. Recheck in 2 weeks with nurse visit.   Vaccines up to date. Discussed shingrix. Pt already has zostavax. He states he will talk to his wife.   Pt needs eye exam. Suspect there could be some cataract forming. Made referral.   ..Start a regular exercise program and make sure you are eating a healthy diet Try to eat 4 servings of dairy a day or take a calcium supplement (500mg  twice a day). Your vaccines are up to date.    Gregory Planas, PA-C  11/14/2016

## 2016-12-06 ENCOUNTER — Ambulatory Visit: Payer: Medicare Other | Admitting: Physician Assistant

## 2016-12-06 DIAGNOSIS — Z0189 Encounter for other specified special examinations: Secondary | ICD-10-CM

## 2017-01-19 ENCOUNTER — Other Ambulatory Visit: Payer: Self-pay

## 2017-01-19 NOTE — Patient Outreach (Signed)
[facility]) Care Management  01/19/2017  Gregory Cuevas 11-29-1933 [id number]   Medication Adherence call to Gregory Cuevas patient is showing past due under [facility] Ins.on Pravastatin 40 mg spoke with patient he said he does not want to fill it at this time because he has a doctors appointment in two weeks and he thinks the doctor will change his medication.(patient only speaks spanish) patient has medication for two weeks.   [place] Management Direct Dial [phone]  Fax [phone] [email or web address]

## 2017-01-26 ENCOUNTER — Encounter: Payer: Medicare Other | Admitting: Physician Assistant

## 2017-02-07 ENCOUNTER — Ambulatory Visit: Payer: Medicare Other | Admitting: Physician Assistant

## 2017-03-18 ENCOUNTER — Other Ambulatory Visit: Payer: Self-pay | Admitting: Physician Assistant

## 2017-04-10 ENCOUNTER — Encounter: Payer: Self-pay | Admitting: Physician Assistant

## 2017-04-10 ENCOUNTER — Ambulatory Visit (INDEPENDENT_AMBULATORY_CARE_PROVIDER_SITE_OTHER): Payer: Medicare Other | Admitting: Physician Assistant

## 2017-04-10 VITALS — BP 170/66 | HR 60 | Ht 65.0 in | Wt 130.0 lb

## 2017-04-10 DIAGNOSIS — Z131 Encounter for screening for diabetes mellitus: Secondary | ICD-10-CM

## 2017-04-10 DIAGNOSIS — E782 Mixed hyperlipidemia: Secondary | ICD-10-CM

## 2017-04-10 DIAGNOSIS — Z8669 Personal history of other diseases of the nervous system and sense organs: Secondary | ICD-10-CM | POA: Diagnosis not present

## 2017-04-10 DIAGNOSIS — R600 Localized edema: Secondary | ICD-10-CM | POA: Diagnosis not present

## 2017-04-10 DIAGNOSIS — R42 Dizziness and giddiness: Secondary | ICD-10-CM | POA: Diagnosis not present

## 2017-04-10 DIAGNOSIS — H539 Unspecified visual disturbance: Secondary | ICD-10-CM | POA: Diagnosis not present

## 2017-04-10 DIAGNOSIS — I10 Essential (primary) hypertension: Secondary | ICD-10-CM

## 2017-04-10 DIAGNOSIS — I872 Venous insufficiency (chronic) (peripheral): Secondary | ICD-10-CM

## 2017-04-10 DIAGNOSIS — N400 Enlarged prostate without lower urinary tract symptoms: Secondary | ICD-10-CM

## 2017-04-10 MED ORDER — HYDROCHLOROTHIAZIDE 12.5 MG PO TABS: 12.5000 mg | ORAL_TABLET | Freq: Every day | ORAL | 1 refills | Status: DC

## 2017-04-10 NOTE — Progress Notes (Signed)
Subjective:    Patient ID: Gregory Cuevas, male    DOB: 02/23/1933, 84 y.o.   MRN: [id number]  HPI  Pt is a 84 yo male who presents to the clinic with an interpreter to discuss dizziness, vision changes, lower leg swelling, varicose veins.   He is most concerned about his dizziness and vision changes of the right eye. His left eye he is completely blind due to retinal detachment. Vision changes started yesterday and has happened twice. Both episodes a circular area like a black spot was noted in a visual field that last about 10 seconds. He has noticed some progressive blurriness but no pain.   His dizziness is also episodic and ongoing for last 4 weeks. Notices it when he leans over or tries to get up. No nausea, vomiting, hearing loss, URI symptoms, ear pain, headache. He fell over in garden while working outside when he went to [employer] over. Not tried anything to make better.   He is also concerned about varicose veins and swelling in bilateral lower ankles. Better in am. Worse at night. He is on his feet a lot. No pain, redness, tenderness to palpataion. Does not wear compression stockings.   .. Active Ambulatory Problems    Diagnosis Date Noted  . Malignant neoplasm of colon ([place]) 02/10/2010  . Hyperlipidemia 02/11/2010  . Cardiomegaly 01/19/2014  . Diastolic dysfunction 35/46/5681  . Mild aortic regurgitation 03/02/2014  . ASD (atrial septal defect) 03/02/2014  . Aortic insufficiency 03/02/2014  . Essential hypertension, benign 11/03/2014  . Acute depression 03/01/2015  . Acute anxiety 03/01/2015  . Constipation 03/18/2015  . Nocturia 03/18/2015  . Acute prostatitis 03/18/2015  . Adenomatous polyp of colon 08/04/2015  . Hyponatremia 06/22/2016  . Fracture of great toe, right, closed 07/25/2016  . Blind left eye 11/14/2016  . Vision changes 11/14/2016  . Chronic venous insufficiency 04/10/2017  . Bilateral lower extremity edema 04/10/2017  . Dizziness 04/10/2017  . History of  retinal detachment 04/10/2017  . BPH (benign prostatic hyperplasia) 04/10/2017   Resolved Ambulatory Problems    Diagnosis Date Noted  . Essential hypertension, benign 02/10/2010  . DARK URINE 02/10/2010  . Rhinorrhea 01/26/2014  . Nasal congestion 01/26/2014   Past Medical History:  Diagnosis Date  . AMI (acute myocardial infarction) ([place]) 1980  . Colon cancer ([place]) 1990's  . Detached retina         Review of Systems  All other systems reviewed and are negative.      Objective:   Physical Exam  Constitutional: He is oriented to person, place, and time. He appears well-developed and well-nourished.  HENT:  Head: Normocephalic and atraumatic.  Right Ear: External ear normal.  Left Ear: External ear normal.  TM"s clear.   Eyes: Pupils are equal, round, and reactive to light. Conjunctivae and EOM are normal. Right eye exhibits no discharge. Left eye exhibits no discharge.  Right eye exam only.  Completely blind in left eye.   Cardiovascular: Normal rate and regular rhythm.  2/6 SEM   Pulmonary/Chest: Effort normal and breath sounds normal. He has no wheezes.  Lymphadenopathy:    He has no cervical adenopathy.  Neurological: He is alert and oriented to person, place, and time. He has normal reflexes. No cranial nerve deficit. Coordination normal.  Negative rhomberg. Normal Gait.  Negative for nystagmus with directional changes.  No ataxia.  Negative for nystagmus on dix hallpike.   Skin: Skin is dry.  Psychiatric: He has a normal mood and  affect. His behavior is normal.          Assessment & Plan:  Marland KitchenMarland KitchenDiagnoses and all orders for this visit:  Dizziness -     COMPLETE METABOLIC PANEL WITH GFR -     Ambulatory referral to Ophthalmology  Vision changes -     COMPLETE METABOLIC PANEL WITH GFR -     Ambulatory referral to Ophthalmology  Chronic venous insufficiency -     hydrochlorothiazide (HYDRODIURIL) 12.5 MG tablet; Take 1 tablet (12.5 mg total) by mouth  daily. As needed for lower leg swelling.  Bilateral lower extremity edema -     hydrochlorothiazide (HYDRODIURIL) 12.5 MG tablet; Take 1 tablet (12.5 mg total) by mouth daily. As needed for lower leg swelling.  Essential hypertension, benign  Mixed hyperlipidemia -     Lipid Panel w/reflex Direct LDL  Screening for diabetes mellitus -     Hemoglobin A1c -     TSH  History of retinal detachment -     Ambulatory referral to Ophthalmology  Benign prostatic hyperplasia, unspecified whether lower urinary tract symptoms present   .[name] Depression screen [facility] 2/9 04/10/2017 07/07/2016 04/16/2013  Decreased Interest 0 0 0  Down, Depressed, Hopeless 0 0 0  PHQ - 2 Score 0 0 0  Altered sleeping 1 - -  Tired, decreased energy 0 - -  Change in appetite 3 - -  Feeling bad or failure about yourself  0 - -  Trouble concentrating 0 - -  Moving slowly or fidgety/restless 0 - -  Suicidal thoughts 0 - -  PHQ-9 Score 4 - -  Difficult doing work/chores Not difficult at all - -   Orthostatic pressures were negative. Negative exam for BPPV. BP elevated today but did not take medication. Reports readings around 140/80 at home. For dizziness will check TSH, CMP. Discussed hydration. I do think some link to positional changes and likely BP dropping. Will follow up in 4 weeks. Be careful with positional changes. Discussed worrisome red flags and if symptoms worsening to call office.   Right eye vision changes. Due to hx and recent 1 day hx of episodic dark spots. Will get in with opthalmology.   Discussed chronic venous insuffiencey. HO given. Consider compression stockings. Reassurance given. HCTZ only as needed for excessive swelling.   BP elevated. Keep BP log and return in 4 weeks.   On statin will recheck lipid.

## 2017-04-10 NOTE — Patient Instructions (Addendum)
Estasis venosa o insuficiencia venosa crnica (Venous Stasis or Chronic Venous Insufficiency) La insuficiencia venosa crnica, tambin llamada estasis venosa, es una enfermedad que afecta las venas de las piernas. Esta afeccin evita el bombeo eficaz de la sangre a travs de las venas. La sangre ya no se bombea correctamente de las piernas al [employer]. La enfermedad puede ser de leve a grave. Con un tratamiento adecuado, podr llevar Gregory Cuevas. CAUSAS La insuficiencia venosa crnica ocurre cuando las paredes de las venas se estiran, debilitan o daan, o cuando las vlvulas de las venas estn daadas. Algunas causas comunes incluyen:  Hipertensin arterial en las venas (hipertensin venosa).  Aumento de la tensin arterial en las venas de las piernas por pasar largos perodos sentado o de pie.  Un cogulo sanguneo que bloquea la circulacin en una vena (trombosis venosa profunda).  Una inflamacin de una vena superficial (flebitis) que forma un cogulo sanguneo . FACTORES DE RIESGO Hay diversos factores que aumentan las probabilidades de desarrollar insuficiencia venosa crnica, por ejemplo:  Antecedentes familiares de la enfermedad.  Obesidad.  Embarazo.  Estilo de vida sedentario.  Fumar.  Trabajos que requieren Equities trader perodos de pie o Print production planner.  Tener [employer]. Las [employer] Foods 40 y 30aos y los hombres de ms de 18 aos tienen una probabilidad mayor de Armed forces training and education officer enfermedad. [name] SNTOMAS Los sntomas pueden ser:  Venas varicosas  Laceracin o lceras en la piel.  Enrojecimiento o cambio de color en la piel de la pierna.  [name], [name] y tirante, y con dolor por arriba del tobillo, generalmente sobre la superficie interna (lipodermatosclerosis).  Hinchazn. DIAGNSTICO Para diagnosticar la enfermedad, el mdico le har una historia clnica y un examen fsico. Para confirmar el diagnstico, le indicarn las siguientes  pruebas:  Ecografa dplex: procedimiento que produce una imagen de los vasos sanguneos y los rganos cercanos, y adems proporciona informacin sobre el flujo sanguneo a travs de los vasos.  Pletismografa: procedimiento que estudia el flujo sanguneo.  Venograma o venografa: procedimiento utilizado para observar las venas mediante una radiografa y [name] sustancia de [place]. TRATAMIENTO Los Ber[employer] Hathaway del tratamiento es que la persona vuelva a tener una vida activa y minimizar el dolor o la discapacidad. El tratamiento depender de la gravedad de la enfermedad. Los procedimientos mdicos pueden necesitarse para casos graves. Las opciones de tratamiento son:  [name] de medias de compresin. Ayudan a [employer] y a [employer]urant probabilidades de que el problema [place], pero no lo curan.  Escleroterapia, un procedimiento que implica la aplicacin de una inyeccin con una sustancia que "disuelve" las venas daadas. Otras venas toman la funcin de las venas daadas.  Ciruga para extraer la vena o cortar el flujo sanguneo a travs de la vena (extirpacin de la vena o ciruga de ablacin con lser).  Ciruga para reparar una vlvula. INSTRUCCIONES PARA EL CUIDADO EN EL HOGAR  Use medias de compresin como le haya indicado su mdico.  Utilice los medicamentos de venta libre o recetados para Glass blower/designer, el malestar o la fiebre, segn se lo indique el mdico.  Concurra a las consultas de control con su mdico segn las indicaciones.  SOLICITE ATENCIN MDICA SI:  Tiene enrojecimiento, hinchazn o aumento del dolor en la zona afectada.  Observa una lnea roja que se extiende por arriba o por debajo de la zona afectada.  Tiene una laceracin o prdida de la piel en la zona afectada, aunque sea pequea.  Se  lesiona la zona afectada.  SOLICITE ATENCIN MDICA DE INMEDIATO SI:  Tiene una lesin y [name] herida abierta en la zona afectada.  El dolor es intenso y no mejora  con los medicamentos.  Tiene adormecimiento o debilidad de repente en el pie o el tobillo por debajo de la zona afectada, o tiene dificultad para mover el pie o el tobillo.  Tiene fiebre o sntomas persistentes durante ms de 2a 3das.  Tiene fiebre y los sntomas empeoran repentinamente.  ASEGRESE DE QUE:  Comprende estas instrucciones.  Controlar su afeccin.  Recibir ayuda de inmediato si no mejora o si empeora.  Esta informacin no tiene Marine scientist el consejo del mdico. Asegrese de hacerle al mdico cualquier pregunta que tenga. Document Released: 04/20/2008 Document Revised: 10/23/2012 Document Reviewed: 09/09/2012 Elsevier Interactive Patient Education  2017 [employer].

## 2017-04-11 LAB — LIPID PANEL W/REFLEX DIRECT LDL
CHOLESTEROL: 208 mg/dL — AB (ref <200)
HDL: 99 mg/dL (ref >40)
LDL Cholesterol (Calc): 94 mg/dL (calc)
Non-HDL Cholesterol (Calc): 109 mg/dL (calc) (ref <130)
TRIGLYCERIDES: 67 mg/dL (ref <150)
Total CHOL/HDL Ratio: 2.1 (calc) (ref <5.0)

## 2017-04-11 LAB — COMPLETE METABOLIC PANEL WITH GFR
AG Ratio: 1.8 (calc) (ref 1.0–2.5)
ALBUMIN MSPROF: 4.2 g/dL (ref 3.6–5.1)
ALT: 35 U/L (ref 9–46)
AST: 41 U/L — ABNORMAL HIGH (ref 10–35)
Alkaline phosphatase (APISO): 69 U/L (ref 40–115)
BUN: 19 mg/dL (ref 7–25)
CHLORIDE: 98 mmol/L (ref 98–110)
CO2: 27 mmol/L (ref 20–32)
CREATININE: 1.1 mg/dL (ref 0.70–1.11)
Calcium: 9.3 mg/dL (ref 8.6–10.3)
GFR, EST AFRICAN AMERICAN: 73 mL/min/{1.73_m2} (ref >=60)
GFR, Est Non African American: 63 mL/min/{1.73_m2} (ref >=60)
GLOBULIN: 2.4 g/dL (ref 1.9–3.7)
GLUCOSE: 91 mg/dL (ref 65–99)
Potassium: 4.6 mmol/L (ref 3.5–5.3)
Sodium: 132 mmol/L — ABNORMAL LOW (ref 135–146)
TOTAL PROTEIN: 6.6 g/dL (ref 6.1–8.1)
Total Bilirubin: 1 mg/dL (ref 0.2–1.2)

## 2017-04-11 LAB — HEMOGLOBIN A1C
HEMOGLOBIN A1C: 5.5 %{Hb} (ref <5.7)
Mean Plasma Glucose: 111 (calc)
eAG (mmol/L): 6.2 (calc)

## 2017-04-11 LAB — TSH: TSH: 1.01 mIU/L (ref 0.40–4.50)

## 2017-04-11 NOTE — Progress Notes (Signed)
Call pt: YOU are NOT a diabetic. Cholesterol looks much better after being on a statin. Continue on this. [place] for refills if needed.  Your sodium is low. Ok to increase salt a little. Keep follow up in 4 weeks. Will recheck sodium and follow up on BP(make sure to take BP medication) and see how dizziness is going.  Confirm patient has heard about eye appt this week.

## 2017-05-16 ENCOUNTER — Ambulatory Visit (INDEPENDENT_AMBULATORY_CARE_PROVIDER_SITE_OTHER): Payer: Medicare Other | Admitting: Physician Assistant

## 2017-05-16 ENCOUNTER — Encounter: Payer: Self-pay | Admitting: Physician Assistant

## 2017-05-16 VITALS — BP 148/62 | HR 61 | Ht 65.0 in | Wt 127.0 lb

## 2017-05-16 DIAGNOSIS — E871 Hypo-osmolality and hyponatremia: Secondary | ICD-10-CM

## 2017-05-16 DIAGNOSIS — R42 Dizziness and giddiness: Secondary | ICD-10-CM

## 2017-05-16 LAB — BASIC METABOLIC PANEL WITH GFR
BUN: 17 mg/dL (ref 7–25)
CO2: 28 mmol/L (ref 20–32)
Calcium: 9.1 mg/dL (ref 8.6–10.3)
Chloride: 98 mmol/L (ref 98–110)
Creat: 0.99 mg/dL (ref 0.70–1.11)
GFR, EST NON AFRICAN AMERICAN: 71 mL/min/{1.73_m2} (ref >=60)
GFR, Est African American: 82 mL/min/{1.73_m2} (ref >=60)
GLUCOSE: 86 mg/dL (ref 65–99)
POTASSIUM: 4.6 mmol/L (ref 3.5–5.3)
SODIUM: 133 mmol/L — AB (ref 135–146)

## 2017-05-16 NOTE — Progress Notes (Signed)
   Subjective:    Patient ID: Gregory Cuevas, male    DOB: 09-11-1933, 84 y.o.   MRN: [id number]  HPI Patient is an 84 year old male with hypertension, ASD, chronic venous insufficiency who presents to the clinic for follow-up on dizziness and hyponatremia.  He was seen in the clinic about 4 weeks ago and labs were ordered that showed a sodium level of 132.  In the past he is actually had a lower sodium level that was not worked up at [employer].  He feels like he is doing a lot better today.  He is taking his lisinopril and HCTZ daily.  His HCTZ is actually written for as needed for lower extremity edema but he is taking it daily.  Overall he feels great he is just following up.  Patient denies any chest pains, palpitations, swelling, shortness of breath.  .. Active Ambulatory Problems    Diagnosis Date Noted  . Malignant neoplasm of colon ([place]) 02/10/2010  . Hyperlipidemia 02/11/2010  . Cardiomegaly 01/19/2014  . Diastolic dysfunction 36/14/4315  . Mild aortic regurgitation 03/02/2014  . ASD (atrial septal defect) 03/02/2014  . Aortic insufficiency 03/02/2014  . Essential hypertension, benign 11/03/2014  . Acute depression 03/01/2015  . Acute anxiety 03/01/2015  . Constipation 03/18/2015  . Nocturia 03/18/2015  . Acute prostatitis 03/18/2015  . Adenomatous polyp of colon 08/04/2015  . Hyponatremia 06/22/2016  . Fracture of great toe, right, closed 07/25/2016  . Blind left eye 11/14/2016  . Vision changes 11/14/2016  . Chronic venous insufficiency 04/10/2017  . Bilateral lower extremity edema 04/10/2017  . Dizziness 04/10/2017  . History of retinal detachment 04/10/2017  . BPH (benign prostatic hyperplasia) 04/10/2017   Resolved Ambulatory Problems    Diagnosis Date Noted  . Essential hypertension, benign 02/10/2010  . DARK URINE 02/10/2010  . Rhinorrhea 01/26/2014  . Nasal congestion 01/26/2014   Past Medical History:  Diagnosis Date  . AMI (acute myocardial infarction) ([place]) 1980  .  Colon cancer ([place]) 1990's  . Detached retina       Review of Systems  All other systems reviewed and are negative.      Objective:   Physical Exam  Constitutional: He is oriented to person, place, and time. He appears well-developed and well-nourished.  HENT:  Head: Normocephalic and atraumatic.  Cardiovascular: Normal rate and regular rhythm.  Murmur heard. Pulmonary/Chest: Effort normal and breath sounds normal.  Neurological: He is alert and oriented to person, place, and time.  Psychiatric: He has a normal mood and affect. His behavior is normal.          Assessment & Plan:  Marland KitchenMarland KitchenDiagnoses and all orders for this visit:  Hyponatremia -     BASIC METABOLIC PANEL WITH GFR  Dizziness   Pt is doing better with dizziness and almost resolved so that is reassuring. Will check sodium level today to make sure doing well. May need to consider stopping HCTZ if continues to be low. Follow up as needed.

## 2017-05-17 ENCOUNTER — Encounter: Payer: Self-pay | Admitting: Physician Assistant

## 2017-05-17 NOTE — Progress Notes (Signed)
Call pt: sodium improved just a tad. We will keep a close eye on it and this level should not be causing any problems.

## 2017-05-19 ENCOUNTER — Other Ambulatory Visit: Payer: Self-pay | Admitting: Physician Assistant

## 2017-05-19 DIAGNOSIS — R600 Localized edema: Secondary | ICD-10-CM

## 2017-05-19 DIAGNOSIS — I872 Venous insufficiency (chronic) (peripheral): Secondary | ICD-10-CM

## 2017-06-19 ENCOUNTER — Other Ambulatory Visit: Payer: Self-pay | Admitting: *Deleted

## 2017-06-19 DIAGNOSIS — E7800 Pure hypercholesterolemia, unspecified: Secondary | ICD-10-CM

## 2017-06-19 DIAGNOSIS — R600 Localized edema: Secondary | ICD-10-CM

## 2017-06-19 DIAGNOSIS — I872 Venous insufficiency (chronic) (peripheral): Secondary | ICD-10-CM

## 2017-06-19 MED ORDER — LISINOPRIL 20 MG PO TABS: 20.0000 mg | ORAL_TABLET | Freq: Every day | ORAL | 0 refills | Status: DC

## 2017-06-19 MED ORDER — HYDROCHLOROTHIAZIDE 12.5 MG PO TABS: ORAL_TABLET | ORAL | 0 refills | Status: DC

## 2017-06-19 MED ORDER — PRAVASTATIN SODIUM 40 MG PO TABS: 40.0000 mg | ORAL_TABLET | Freq: Every day | ORAL | 4 refills | Status: DC

## 2017-11-16 ENCOUNTER — Ambulatory Visit: Payer: Medicare Other | Admitting: Physician Assistant

## 2019-08-20 ENCOUNTER — Telehealth (INDEPENDENT_AMBULATORY_CARE_PROVIDER_SITE_OTHER): Payer: Medicare Other | Admitting: Physician Assistant

## 2019-08-20 ENCOUNTER — Encounter: Payer: Self-pay | Admitting: Physician Assistant

## 2019-08-20 VITALS — BP 140/74 | HR 59 | Ht 65.0 in | Wt 135.0 lb

## 2019-08-20 DIAGNOSIS — E871 Hypo-osmolality and hyponatremia: Secondary | ICD-10-CM | POA: Diagnosis not present

## 2019-08-20 DIAGNOSIS — N401 Enlarged prostate with lower urinary tract symptoms: Secondary | ICD-10-CM

## 2019-08-20 DIAGNOSIS — I351 Nonrheumatic aortic (valve) insufficiency: Secondary | ICD-10-CM

## 2019-08-20 DIAGNOSIS — E7800 Pure hypercholesterolemia, unspecified: Secondary | ICD-10-CM

## 2019-08-20 DIAGNOSIS — R5383 Other fatigue: Secondary | ICD-10-CM | POA: Diagnosis not present

## 2019-08-20 DIAGNOSIS — R531 Weakness: Secondary | ICD-10-CM | POA: Insufficient documentation

## 2019-08-20 DIAGNOSIS — R350 Frequency of micturition: Secondary | ICD-10-CM

## 2019-08-20 DIAGNOSIS — I10 Essential (primary) hypertension: Secondary | ICD-10-CM | POA: Diagnosis not present

## 2019-08-20 NOTE — Progress Notes (Signed)
Patient ID: Gregory Cuevas, male   DOB: [birth year], 86 y.o.   MRN: [id number] .Marland KitchenVirtual Visit via Telephone Note  I connected with Lupita Shutter on 08/20/19 at  9:10 AM EDT by telephone and verified that I am speaking with the correct person using two identifiers.  Location: Patient: home Provider: clinic   I discussed the limitations, risks, security and privacy concerns of performing an evaluation and management service by telephone and the availability of in person appointments. I also discussed with the patient that there may be a patient responsible charge related to this service. The patient expressed understanding and agreed to proceed.   History of Present Illness: Pt is a 86 yo male post-covid history, HTN, HLD, mild aortic insufficiency who presents to the clinic via phone to discuss fatigue and weakness. Pt son is also on the phone to help with communication due to [place] as primary language.   Pt is currently not on any prescription medications. He had covid late 2020 in [place]. He had mild to moderate symptoms but was not hospitalized. He was really weak for a while but once he got back to [place] felt some better. He is eating better and gaining weight. He is sleeping well.   He continues to feel like he is more weak and tired than he should be.  He describes some bilateral leg weakness after mowing the lawn the other day.  He denies any shortness of breath, swelling of extremities, headaches, orthopnea, chest pain, palpitations.  He has not had any stool changes such as melena or hematochezia.  He has not tried anything to increase his energy and make better.  .. Active Ambulatory Problems    Diagnosis Date Noted  . Malignant neoplasm of colon ([place]) 02/10/2010  . Hyperlipidemia 02/11/2010  . Cardiomegaly 01/19/2014  . Diastolic dysfunction 84/16/6063  . Mild aortic regurgitation 03/02/2014  . ASD (atrial septal defect) 03/02/2014  . Aortic insufficiency 03/02/2014  . Essential  hypertension, benign 11/03/2014  . Acute depression 03/01/2015  . Acute anxiety 03/01/2015  . Constipation 03/18/2015  . Nocturia 03/18/2015  . Acute prostatitis 03/18/2015  . Adenomatous polyp of colon 08/04/2015  . Hyponatremia 06/22/2016  . Fracture of great toe, right, closed 07/25/2016  . Blind left eye 11/14/2016  . Vision changes 11/14/2016  . Chronic venous insufficiency 04/10/2017  . Bilateral lower extremity edema 04/10/2017  . Dizziness 04/10/2017  . History of retinal detachment 04/10/2017  . Benign prostatic hyperplasia without lower urinary tract symptoms 04/10/2017  . Weakness 08/20/2019   Resolved Ambulatory Problems    Diagnosis Date Noted  . Essential hypertension, benign 02/10/2010  . DARK URINE 02/10/2010  . Rhinorrhea 01/26/2014  . Nasal congestion 01/26/2014   Past Medical History:  Diagnosis Date  . AMI (acute myocardial infarction) ([place]) 1980  . Colon cancer ([place]) 1990's  . Detached retina    Reviewed med, allergy, problem list.     Observations/Objective: No acute distress.  Normal mood.  Normal breathing.   .. Today's Vitals   08/20/19 0852 08/20/19 1021  BP: (!) 148/77 140/74  Pulse: (!) 59   Weight: 135 lb (61.2 kg)   Height: 5\' 5"  (1.651 m)    Body mass index is 22.47 kg/m.    Assessment and Plan: Marland KitchenMarland KitchenKadrian was seen today for fatigue.  Diagnoses and all orders for this visit:  Fatigue, unspecified type -     COMPLETE METABOLIC PANEL WITH GFR -     TSH -  CBC with Differential/Platelet -     PSA -     Brain natriuretic peptide  Hyponatremia -     COMPLETE METABOLIC PANEL WITH GFR  Essential hypertension, benign -     COMPLETE METABOLIC PANEL WITH GFR  Mild aortic regurgitation -     COMPLETE METABOLIC PANEL WITH GFR -     Brain natriuretic peptide  Pure hypercholesterolemia -     Lipid Panel w/reflex Direct LDL  Weakness -     COMPLETE METABOLIC PANEL WITH GFR -     TSH -     CBC with Differential/Platelet -      PSA -     Brain natriuretic peptide  Benign prostatic hyperplasia with urinary frequency -     PSA   It has been awhile since patient has been seen. Needs fasting labs. Ordered today. He is not on any medication.   BP elevated today but 2nd recheck did go down. Discussed low salt diet and regular exercise. Pt does not want to take medication. Right now due to age and current risk factors he is at goal.   Will work up fatigue with labs. He does have history of hyponatremia and need to evaluate. No signs or symptoms of worsening heart failure. Will check BMP with cardiomegaly/aortic insufficiency/fatigue. Denies any edema. May consider echo in the future. Consider post viral covid fatigue. Pt is sleeping well. He is eating well.  Follow up after labs or if symptoms change or worsen.    Follow Up Instructions:    I discussed the assessment and treatment plan with the patient. The patient was provided an opportunity to ask questions and all were answered. The patient agreed with the plan and demonstrated an understanding of the instructions.   The patient was advised to call back or seek an in-person evaluation if the symptoms worsen or if the condition fails to improve as anticipated.  I provided 25 minutes of non-face-to-face time during this encounter.   [name], PA-C

## 2019-08-22 ENCOUNTER — Other Ambulatory Visit: Payer: Self-pay | Admitting: Neurology

## 2019-08-22 ENCOUNTER — Other Ambulatory Visit: Payer: Self-pay | Admitting: Physician Assistant

## 2019-08-22 DIAGNOSIS — Q211 Atrial septal defect, unspecified: Secondary | ICD-10-CM

## 2019-08-22 DIAGNOSIS — I351 Nonrheumatic aortic (valve) insufficiency: Secondary | ICD-10-CM

## 2019-08-22 DIAGNOSIS — I517 Cardiomegaly: Secondary | ICD-10-CM

## 2019-08-22 LAB — COMPLETE METABOLIC PANEL WITH GFR
AG Ratio: 1.7 (calc) (ref 1.0–2.5)
ALT: 12 U/L (ref 9–46)
AST: 17 U/L (ref 10–35)
Albumin: 3.8 g/dL (ref 3.6–5.1)
Alkaline phosphatase (APISO): 51 U/L (ref 35–144)
BUN: 23 mg/dL (ref 7–25)
CO2: 28 mmol/L (ref 20–32)
Calcium: 8.9 mg/dL (ref 8.6–10.3)
Chloride: 105 mmol/L (ref 98–110)
Creat: 1.07 mg/dL (ref 0.70–1.11)
GFR, Est African American: 73 mL/min/{1.73_m2} (ref >=60)
GFR, Est Non African American: 63 mL/min/{1.73_m2} (ref >=60)
Globulin: 2.2 g/dL (calc) (ref 1.9–3.7)
Glucose, Bld: 83 mg/dL (ref 65–99)
Potassium: 4.4 mmol/L (ref 3.5–5.3)
Sodium: 139 mmol/L (ref 135–146)
Total Bilirubin: 0.7 mg/dL (ref 0.2–1.2)
Total Protein: 6 g/dL — ABNORMAL LOW (ref 6.1–8.1)

## 2019-08-22 LAB — CBC WITH DIFFERENTIAL/PLATELET
Absolute Monocytes: 361 cells/uL (ref 200–950)
Basophils Absolute: 21 cells/uL (ref 0–200)
Basophils Relative: 0.5 %
Eosinophils Absolute: 88 cells/uL (ref 15–500)
Eosinophils Relative: 2.1 %
HCT: 44.3 % (ref 38.5–50.0)
Hemoglobin: 14.6 g/dL (ref 13.2–17.1)
Lymphs Abs: 1365 cells/uL (ref 850–3900)
MCH: 30 pg (ref 27.0–33.0)
MCHC: 33 g/dL (ref 32.0–36.0)
MCV: 91.2 fL (ref 80.0–100.0)
MPV: 10.5 fL (ref 7.5–12.5)
Monocytes Relative: 8.6 %
Neutro Abs: 2365 cells/uL (ref 1500–7800)
Neutrophils Relative %: 56.3 %
Platelets: 210 10*3/uL (ref 140–400)
RBC: 4.86 10*6/uL (ref 4.20–5.80)
RDW: 12.2 % (ref 11.0–15.0)
Total Lymphocyte: 32.5 %
WBC: 4.2 10*3/uL (ref 3.8–10.8)

## 2019-08-22 LAB — LIPID PANEL W/REFLEX DIRECT LDL
Cholesterol: 259 mg/dL — ABNORMAL HIGH (ref <200)
HDL: 81 mg/dL (ref >=40)
LDL Cholesterol (Calc): 157 mg/dL (calc) — ABNORMAL HIGH
Non-HDL Cholesterol (Calc): 178 mg/dL (calc) — ABNORMAL HIGH (ref <130)
Total CHOL/HDL Ratio: 3.2 (calc) (ref <5.0)
Triglycerides: 97 mg/dL (ref <150)

## 2019-08-22 LAB — PSA: PSA: 0.6 ng/mL (ref <=4.0)

## 2019-08-22 LAB — TSH: TSH: 1.01 mIU/L (ref 0.40–4.50)

## 2019-08-22 LAB — BRAIN NATRIURETIC PEPTIDE: Brain Natriuretic Peptide: 82 pg/mL (ref <100)

## 2019-08-22 MED ORDER — PRAVASTATIN SODIUM 40 MG PO TABS
40.0000 mg | ORAL_TABLET | Freq: Every day | ORAL | 1 refills | Status: DC
Start: 2019-08-22 — End: 2020-02-23

## 2019-08-22 NOTE — Progress Notes (Signed)
Please call sons number on file.   LDL(bad cholesterol is elevated). He is off cholesterol medication so this makes sense.  HDL(good cholesterol) is really good.  Cholesterol being elevated does increase your overall cardiac event risk. It would be recommended that you go back on pravachol.   Thyroid perfect.  Prostate looks good.  No anemia. CBC looks good.  Electrolytes look good.  Liver and kidney look good.  Protein is low. This could be coming up from decrease of nutrition while he was sick with covid.   Labs look good.  Take OTC vitamin D 1000 units and B12 1036mcg.  Will order echo to fully evaluate the heart.  Follow up as needed or if symptoms change or worsen.   Gregory Cuevas

## 2019-08-22 NOTE — Progress Notes (Signed)
BNP looks good. This is good news that none of his fatigue is due to heart not working as good and creating extra fluid build up.

## 2019-08-25 ENCOUNTER — Other Ambulatory Visit: Payer: Self-pay

## 2019-08-25 ENCOUNTER — Ambulatory Visit (HOSPITAL_COMMUNITY)
Admission: RE | Admit: 2019-08-25 | Discharge: 2019-08-25 | Disposition: A | Discharge status: Home / Self Care | Payer: Medicare Other | Source: Ambulatory Visit | Attending: Physician Assistant | Admitting: Physician Assistant

## 2019-08-25 DIAGNOSIS — I517 Cardiomegaly: Secondary | ICD-10-CM | POA: Diagnosis present

## 2019-08-25 DIAGNOSIS — Q211 Atrial septal defect, unspecified: Secondary | ICD-10-CM

## 2019-08-25 DIAGNOSIS — I252 Old myocardial infarction: Secondary | ICD-10-CM | POA: Insufficient documentation

## 2019-08-25 DIAGNOSIS — Z859 Personal history of malignant neoplasm, unspecified: Secondary | ICD-10-CM | POA: Insufficient documentation

## 2019-08-25 DIAGNOSIS — I351 Nonrheumatic aortic (valve) insufficiency: Secondary | ICD-10-CM

## 2019-08-25 NOTE — Progress Notes (Signed)
  Echocardiogram 2D Echocardiogram has been performed.  Gregory Cuevas 08/25/2019, 2:39 PM

## 2019-08-26 LAB — ECHOCARDIOGRAM COMPLETE
Area-P 1/2: 2.24 cm2
P 1/2 time: 436 msec
S' Lateral: 4.4 cm

## 2019-08-27 ENCOUNTER — Other Ambulatory Visit: Payer: Self-pay | Admitting: Neurology

## 2019-08-27 DIAGNOSIS — I351 Nonrheumatic aortic (valve) insufficiency: Secondary | ICD-10-CM

## 2019-08-27 DIAGNOSIS — I517 Cardiomegaly: Secondary | ICD-10-CM

## 2019-08-27 DIAGNOSIS — Q211 Atrial septal defect, unspecified: Secondary | ICD-10-CM

## 2019-08-27 MED ORDER — LISINOPRIL 5 MG PO TABS
5.0000 mg | ORAL_TABLET | Freq: Every day | ORAL | 0 refills | Status: DC
Start: 2019-08-27 — End: 2019-11-24

## 2019-08-27 NOTE — Progress Notes (Signed)
Call pt: there are some abnormalities to heart echo. His ejection fraction is decreased to 50-55 percent which means his heart is not pushing out blood like it should. It appears that his aortic valve is not working right and allowing regurgitation moderate to severe. Pt needs to see cardiology to get on the best plan. ACE inhibitors are first line to help with cardiac function. I would like to start you on 5mg  of lisinopril. Is that ok?

## 2019-09-04 ENCOUNTER — Encounter: Payer: Self-pay | Admitting: General Practice

## 2019-10-15 ENCOUNTER — Telehealth: Payer: Self-pay

## 2019-10-15 NOTE — Telephone Encounter (Signed)
Billing trailer for BNP.   I11.0 Hypertensive heart disease with heart failure  I13.0 Hypertensive heart and chronic kidney disease with heart failure and stage 1 through stage 4 chronic kidney disease, or unspecified chronic kidney disease  I50.1 Left ventricular failure  I50.20 Unspecified systolic (congestive) heart failure  I33.82 Acute systolic (congestive) heart failure  N05.39 Chronic systolic (congestive) heart failure  I50.23 Acute on chronic systolic (congestive) heart failure  I50.30 Unspecified diastolic (congestive) heart failure  J67.34 Acute diastolic (congestive) heart failure  L93.79 Chronic diastolic (congestive) heart failure  I50.33 Acute on chronic diastolic (congestive) heart failure  I50.40 Unspecified combined systolic (congestive) and diastolic (congestive) heart failure  I50.42 Chronic combined systolic (congestive) and diastolic (congestive) heart failure  I50.9 Heart failure, unspecified  R06.00 Dyspnea, unspecified  R06.02 Shortness of breath  R06.09 Other forms of dyspnea  R06.2 Wheezing  R06.83 Snoring  R06.89 Other abnormalities of breathing

## 2019-10-15 NOTE — Telephone Encounter (Signed)
SOB RO6.02

## 2019-10-16 NOTE — Telephone Encounter (Signed)
Added to billing trailer.

## 2019-10-31 NOTE — Progress Notes (Signed)
Referring-Jade Breeback PA-C Reason for referral-aortic insufficiency  HPI: 86 year old male for evaluation of aortic insufficiency at request of [name] PA-C.  Echocardiogram 2016 showed normal LV function, moderate aortic insufficiency, mild biatrial enlargement, mild mitral regurgitation and small secundum ASD.  There was moderate tricuspid regurgitation.  Echocardiogram August 2021 showed ejection fraction 50 to 55%, mild left ventricular enlargement, mild left atrial enlargement, mild mitral regurgitation, moderate to severe aortic insufficiency.  Patient states that at age 19 he was told he had a heart attack but there was no scar noted.  I have no records available.  He had Covid approximately 1 year ago and has had fatigue since then.  However he does not have dyspnea on exertion, orthopnea, PND, pedal edema, exertional chest pain or syncope.  Cardiology asked to evaluate.  Current Outpatient Medications  Medication Sig Dispense Refill  . aspirin 81 MG tablet Take 81 mg by mouth daily.    . Calcium Carbonate-Vitamin D (CALCIUM 500 + D PO) Take 1 tablet by mouth daily.    [name] lisinopril (ZESTRIL) 5 MG tablet Take 1 tablet (5 mg total) by mouth daily. 90 tablet 0  . pravastatin (PRAVACHOL) 40 MG tablet Take 1 tablet (40 mg total) by mouth daily. 90 tablet 1  . Red Yeast Rice Extract (RED YEAST RICE PO) Take by mouth.     No current facility-administered medications for this visit.    No Known Allergies   Past Medical History:  Diagnosis Date  . AMI (acute myocardial infarction) ([place]) 1980  . Colon cancer ([place]) 1990's  . Detached retina   . Hyperlipidemia   . Hypertension     Past Surgical History:  Procedure Laterality Date  . EYE SURGERY    . PARTIAL COLECTOMY      Social History   Socioeconomic History  . Marital status: Married    Spouse name: Not on file  . Number of children: 2  . Years of education: Not on file  . Highest education level: Not on file    Occupational History  . Not on file  Tobacco Use  . Smoking status: Never Smoker  . Smokeless tobacco: Never Used  Substance and Sexual Activity  . Alcohol use: Yes    Comment: Occasional  . Drug use: No  . Sexual activity: Not on file  Other Topics Concern  . Not on file  Social History Narrative  . Not on file   Social Determinants of Health   Financial Resource Strain:   . Difficulty of Paying Living Expenses: Not on file  Food Insecurity:   . Worried About Charity fundraiser in the Last Year: Not on file  . Ran Out of Food in the Last Year: Not on file  Transportation Needs:   . Lack of Transportation (Medical): Not on file  . Lack of Transportation (Non-Medical): Not on file  Physical Activity:   . Days of Exercise per Week: Not on file  . Minutes of Exercise per Session: Not on file  Stress:   . Feeling of Stress : Not on file  Social Connections:   . Frequency of Communication with Friends and Family: Not on file  . Frequency of Social Gatherings with Friends and Family: Not on file  . Attends Religious Services: Not on file  . Active Member of Clubs or Organizations: Not on file  . Attends Archivist Meetings: Not on file  . Marital Status: Not on file  Intimate Partner  Violence:   . Fear of Current or Ex-Partner: Not on file  . Emotionally Abused: Not on file  . Physically Abused: Not on file  . Sexually Abused: Not on file    Family History  Problem Relation Age of Onset  . Liver cancer Father        deceased  . Cancer Father        liver  . Cancer Brother        colon and esophageal  . Alzheimer's disease Other        2 sisters  . Colon cancer Other        2 brothers  . Esophageal cancer Other     ROS: no fevers or chills, productive cough, hemoptysis, dysphasia, odynophagia, melena, hematochezia, dysuria, hematuria, rash, seizure activity, orthopnea, PND, pedal edema, claudication. Remaining systems are negative.  Physical Exam:    Blood pressure (!) 142/88, pulse (!) 52, height 5\' 4"  (1.626 m), weight 132 lb (59.9 kg).  General:  Well developed/well nourished in NAD Skin warm/dry Patient not depressed No peripheral clubbing Back-normal HEENT-abnormal left eye Neck supple/normal carotid upstroke bilaterally; no bruits; no JVD; no thyromegaly chest - CTA/ normal expansion CV - RRR/normal S1 and S2; no murmurs, rubs or gallops;  PMI nondisplaced Abdomen -NT/ND, no HSM, no mass, + bowel sounds, no bruit 2+ femoral pulses, no bruits Ext-no edema, chords, 2+ DP Neuro-grossly nonfocal  ECG -sinus bradycardia at a rate of 52, no ST changes.  Personally reviewed  A/P  1 aortic insufficiency-moderate to severe on recent echocardiogram.  Patient is not having symptoms of dyspnea and no history of CHF.  We will plan to repeat echocardiogram 6 months from his most recent and I will see him back after that.  I discussed aortic insufficiency and the fact that it may require aortic valve replacement if it worsens in the future.  2 question history of small ASD-right heart not enlarged.  Plan follow-up echocardiogram in February as outlined above.  3 hypertension-blood pressure borderline.  I have asked him to follow this and we will increase medications as needed.  4 hyperlipidemia-continue statin.  5 question history of coronary artery disease-continue aspirin and statin.  He denies chest pain.  Kirk Ruths, MD

## 2019-11-05 ENCOUNTER — Encounter: Payer: Self-pay | Admitting: Cardiology

## 2019-11-05 ENCOUNTER — Ambulatory Visit: Payer: Medicare Other | Admitting: Cardiology

## 2019-11-05 ENCOUNTER — Other Ambulatory Visit: Payer: Self-pay

## 2019-11-05 VITALS — BP 142/88 | HR 52 | Ht 64.0 in | Wt 132.0 lb

## 2019-11-05 DIAGNOSIS — I10 Essential (primary) hypertension: Secondary | ICD-10-CM

## 2019-11-05 DIAGNOSIS — I351 Nonrheumatic aortic (valve) insufficiency: Secondary | ICD-10-CM

## 2019-11-05 DIAGNOSIS — E7800 Pure hypercholesterolemia, unspecified: Secondary | ICD-10-CM | POA: Diagnosis not present

## 2019-11-05 NOTE — Patient Instructions (Signed)
  Testing/Procedures: Your physician has requested that you have an echocardiogram. Echocardiography is a painless test that uses sound waves to create images of your heart. It provides your doctor with information about the size and shape of your heart and how well your heart's chambers and valves are working. This procedure takes approximately one hour. There are no restrictions for this procedure.[place]     Follow-Up: At [facility], you and your health needs are our priority.  As part of our continuing mission to provide you with exceptional heart care, we have created designated Provider Care Teams.  These Care Teams include your primary Cardiologist (physician) and Advanced Practice Providers (APPs -  Physician Assistants and Nurse Practitioners) who all work together to provide you with the care you need, when you need it.  We recommend signing up for the patient portal called "MyChart".  Sign up information is provided on this After Visit Summary.  MyChart is used to connect with patients for Virtual Visits (Telemedicine).  Patients are able to view lab/test results, encounter notes, upcoming appointments, etc.  Non-urgent messages can be sent to your provider as well.   To learn more about what you can do with MyChart, go to [email or web address].    Your next appointment:   4 month(s)  The format for your next appointment:   In Person  Provider:   Kirk Ruths, MD

## 2019-11-07 ENCOUNTER — Telehealth: Payer: Self-pay | Admitting: Neurology

## 2019-11-07 NOTE — Telephone Encounter (Signed)
Patient's son Dacotah [phone]) called wanting to get his dad scheduled for a J&J vaccine booster. He called [facility] and [facility] and they aren't offering this. He is looking for direction.

## 2019-11-10 NOTE — Telephone Encounter (Signed)
Patient's son made aware that pharmacies just announced Friday they would be carrying the vaccine booster for J&J so made him aware to give it some time and they should be available.

## 2019-11-11 NOTE — Addendum Note (Signed)
Addended by: Jacqulynn Cadet on: 11/11/2019 04:11 PM   Modules accepted: Orders

## 2019-11-22 ENCOUNTER — Other Ambulatory Visit: Payer: Self-pay | Admitting: Physician Assistant

## 2020-02-21 ENCOUNTER — Other Ambulatory Visit: Payer: Self-pay | Admitting: Physician Assistant

## 2020-02-23 ENCOUNTER — Telehealth (HOSPITAL_COMMUNITY): Payer: Self-pay | Admitting: Cardiology

## 2020-02-23 NOTE — Telephone Encounter (Signed)
pts son Celso cancelled  His echocardiogram and states that pt does not want to come right now. They will call back at a later date to reschedule. Order will be removed from he Wq and when pt calls back to schedule we will reinstate the order.

## 2020-02-24 ENCOUNTER — Other Ambulatory Visit: Payer: Self-pay | Admitting: Physician Assistant

## 2020-02-26 ENCOUNTER — Other Ambulatory Visit (HOSPITAL_COMMUNITY): Payer: Medicare Other

## 2020-03-01 NOTE — Progress Notes (Deleted)
HPI: FU AI.  Echocardiogram 2016 showed normal LV function, moderate aortic insufficiency, mild biatrial enlargement, mild mitral regurgitation and small secundum ASD.  There was moderate tricuspid regurgitation.  Echocardiogram August 2021 showed ejection fraction 50 to 55%, mild left ventricular enlargement, mild left atrial enlargement, mild mitral regurgitation, moderate to severe aortic insufficiency.    Since last seen  Current Outpatient Medications  Medication Sig Dispense Refill  . aspirin 81 MG tablet Take 81 mg by mouth daily.    . Calcium Carbonate-Vitamin D (CALCIUM 500 + D PO) Take 1 tablet by mouth daily.    [name] lisinopril (ZESTRIL) 5 MG tablet TAKE 1 TABLET(5 MG) BY MOUTH DAILY 90 tablet 1  . pravastatin (PRAVACHOL) 40 MG tablet TAKE 1 TABLET(40 MG) BY MOUTH DAILY 90 tablet 1  . Red Yeast Rice Extract (RED YEAST RICE PO) Take by mouth.     No current facility-administered medications for this visit.     Past Medical History:  Diagnosis Date  . AMI (acute myocardial infarction) ([place]) 1980  . Colon cancer ([place]) 1990's  . Detached retina   . Hyperlipidemia   . Hypertension     Past Surgical History:  Procedure Laterality Date  . EYE SURGERY    . PARTIAL COLECTOMY      Social History   Socioeconomic History  . Marital status: Married    Spouse name: Not on file  . Number of children: 2  . Years of education: Not on file  . Highest education level: Not on file  Occupational History  . Not on file  Tobacco Use  . Smoking status: Never Smoker  . Smokeless tobacco: Never Used  Substance and Sexual Activity  . Alcohol use: Yes    Comment: Occasional  . Drug use: No  . Sexual activity: Not on file  Other Topics Concern  . Not on file  Social History Narrative  . Not on file   Social Determinants of Health   Financial Resource Strain: Not on file  Food Insecurity: Not on file  Transportation Needs: Not on file  Physical Activity: Not on file   Stress: Not on file  Social Connections: Not on file  Intimate Partner Violence: Not on file    Family History  Problem Relation Age of Onset  . Liver cancer Father        deceased  . Cancer Father        liver  . Cancer Brother        colon and esophageal  . Alzheimer's disease Other        2 sisters  . Colon cancer Other        2 brothers  . Esophageal cancer Other     ROS: no fevers or chills, productive cough, hemoptysis, dysphasia, odynophagia, melena, hematochezia, dysuria, hematuria, rash, seizure activity, orthopnea, PND, pedal edema, claudication. Remaining systems are negative.  Physical Exam: Well-developed well-nourished in no acute distress.  Skin is warm and dry.  HEENT is normal.  Neck is supple.  Chest is clear to auscultation with normal expansion.  Cardiovascular exam is regular rate and rhythm.  Abdominal exam nontender or distended. No masses palpated. Extremities show no edema. neuro grossly intact  ECG- personally reviewed  A/P  1 aortic insufficiency-patient remains asymptomatic.  We will plan to repeat echocardiogram.  We have again discussed that if he develops symptoms, reduced LV function or left ventricular enlargement he may require aortic valve replacement.  2 question  history of small ASD-await follow-up echocardiogram.  3 hypertension-patient's blood pressure is controlled.  Continue present medications.  4 hyperlipidemia-continue statin.  5 question history of coronary artery disease-patient denies chest pain.  Continue medical therapy with aspirin and statin.  Kirk Ruths, MD

## 2020-03-10 ENCOUNTER — Ambulatory Visit: Payer: Medicare Other | Admitting: Cardiology

## 2020-04-26 ENCOUNTER — Ambulatory Visit (INDEPENDENT_AMBULATORY_CARE_PROVIDER_SITE_OTHER): Payer: Medicare Other | Admitting: Physician Assistant

## 2020-04-26 ENCOUNTER — Encounter: Payer: Self-pay | Admitting: Physician Assistant

## 2020-04-26 ENCOUNTER — Other Ambulatory Visit: Payer: Self-pay

## 2020-04-26 VITALS — BP 139/64 | HR 74 | Ht 64.0 in | Wt 130.0 lb

## 2020-04-26 DIAGNOSIS — Z0000 Encounter for general adult medical examination without abnormal findings: Secondary | ICD-10-CM

## 2020-04-26 DIAGNOSIS — I351 Nonrheumatic aortic (valve) insufficiency: Secondary | ICD-10-CM | POA: Diagnosis not present

## 2020-04-26 DIAGNOSIS — Z1329 Encounter for screening for other suspected endocrine disorder: Secondary | ICD-10-CM

## 2020-04-26 DIAGNOSIS — Q211 Atrial septal defect, unspecified: Secondary | ICD-10-CM

## 2020-04-26 DIAGNOSIS — E7800 Pure hypercholesterolemia, unspecified: Secondary | ICD-10-CM

## 2020-04-26 DIAGNOSIS — Z125 Encounter for screening for malignant neoplasm of prostate: Secondary | ICD-10-CM

## 2020-04-26 DIAGNOSIS — I517 Cardiomegaly: Secondary | ICD-10-CM

## 2020-04-26 DIAGNOSIS — I10 Essential (primary) hypertension: Secondary | ICD-10-CM | POA: Diagnosis not present

## 2020-04-26 MED ORDER — LISINOPRIL 5 MG PO TABS: ORAL_TABLET | ORAL | 1 refills | Status: DC

## 2020-04-26 NOTE — Patient Instructions (Addendum)
Needs Tdap and shingrix and covid booster. Get labs.   Mantenimiento de la salud despus de los 20 aos de edad Health Maintenance After Age 85 Despus de los 65 aos de edad, corre un riesgo mayor de Tourist information centre manager enfermedades e infecciones a Barrister's clerk, como tambin de sufrir lesiones por cadas. Las cadas son la causa principal de las fracturas de huesos y lesiones en la cabeza de personas mayores de 82 aos de edad. Recibir cuidados preventivos de forma regular puede ayudarlo a mantenerse saludable y en buen [place]. Los cuidados preventivos incluyen realizarse anlisis de forma regular y Actor en el estilo de vida segn las recomendaciones del mdico. Converse con el profesional que lo asiste sobre:  Las pruebas de deteccin y los anlisis que debe Dispensing optician. Una prueba de deteccin es un estudio que se para Hydrographic surveyor la presencia de una enfermedad cuando no tiene sntomas.  Un plan de dieta y ejercicios adecuado para usted. Qu debo saber sobre las pruebas de deteccin y los anlisis para prevenir cadas? Realizarse pruebas de deteccin y [employer] es la mejor manera de Hydrographic surveyor un problema de salud de forma temprana. El diagnstico y tratamiento tempranos le brindan la mejor oportunidad de Chief Technology Officer las afecciones mdicas que son comunes despus de los 96 aos de edad. Ciertas afecciones y elecciones de estilo de vida pueden hacer que sea ms propenso a sufrir Engineer, manufacturing. El mdico puede recomendarle lo siguiente:  Controles regulares de la visin. Una visin deficiente y afecciones como las cataratas pueden hacer que sea ms propenso a sufrir Engineer, manufacturing. Si [place] lentes, asegrese de obtener una receta actualizada si su visin cambia.  Revisin de medicamentos. Revise regularmente con el mdico todos los medicamentos que toma, incluidos los medicamentos de [place]. Consulte al [employer] efectos secundarios que pueden hacer que sea ms propenso a sufrir Engineer, manufacturing. Informe al  mdico si alguno de los medicamentos que toma lo hace sentir mareado o somnoliento.  Pruebas de deteccin para la osteoporosis. La osteoporosis es una afeccin que hace que los huesos se vuelvan ms frgiles. En consecuencia, los huesos pueden debilitarse y quebrarse ms fcilmente.  Pruebas de deteccin para la presin arterial. Los cambios en la presin arterial y los medicamentos para Chief Technology Officer la presin arterial pueden hacerlo sentir mareado.  Controles de fuerza y equilibrio. El mdico puede recomendar ciertos estudios para controlar su fuerza y equilibrio al estar de pie, al caminar o al cambiar de posicin.  Examen de los pies. El dolor y Chiropractor en los pies, como tambin no utilizar el calzado [place], pueden hacer que sea ms propenso a sufrir Engineer, manufacturing.  Prueba de deteccin de la depresin. Es ms probable que sufra una cada si tiene miedo a caerse, se siente mal emocionalmente o se siente incapaz de Patent examiner.  Prueba de deteccin de consumo de alcohol. Beber demasiado alcohol puede afectar su equilibrio y puede hacer que sea ms propenso a sufrir Engineer, manufacturing. Qu medidas puedo tomar para reducir mi riesgo de sufrir una cada? Instrucciones generales  Hable con el mdico sobre sus riesgos de sufrir una cada. Infrmele a su mdico si: ? Se cae. Asegrese de informarle a su mdico acerca de todas las cadas, incluso aquellas que parecen ser [employer] ? Se siente mareado, somnoliento o que pierde el equilibrio.  Tome los medicamentos de venta libre y los recetados solamente como se lo haya indicado el mdico. Estos incluyen todos los suplementos.  Siga [name]  dieta sana y [place] un peso saludable. Una dieta saludable incluye productos lcteos descremados, carnes bajas en contenido de grasa ([place], [place] de granos enteros, frijoles y [place] frutas y verduras. La seguridad en el hogar  Retire los objetos que puedan causar tropiezos tales como  alfombras, cables u obstculos.  Instale equipos de seguridad, como barras para sostn en los baos y barandas de seguridad en las escaleras.  [place] habitaciones y los pasillos bien iluminados. Actividad  Siga un programa de ejercicio regular para mantenerse en forma. Esto lo ayudar a Contractor equilibrio. Consulte al mdico qu tipos de ejercicios son adecuados para usted.  Si necesita un bastn o un andador, selo segn las recomendaciones del mdico.  Utilice calzado con buen apoyo y suela antideslizante.   Estilo de vida  No beba alcohol si el mdico le indica que no beba.  Si bebe alcohol, limite la cantidad que consume: ? De 0 a 1 medida por da para las mujeres. ? De 0 a 2 medidas por da para los hombres.  Est atento a la cantidad de alcohol que contiene su bebida. En los [place] una medida equivale a una botella tpica de cerveza (12 onzas), media copa de vino (5 onzas) o una medida de bebida blanca (1 onza).  No consuma ningn producto que contenga nicotina o tabaco, como cigarrillos y Psychologist, sport and exercise. Si necesita ayuda para dejar de fumar, consulte al mdico. Resumen  Tener un estilo de vida saludable y recibir cuidados preventivos pueden ayudar a Theatre stage manager salud y el bienestar despus de los 68 aos de [place].  Realizarse pruebas de deteccin y [employer] es la mejor manera de Hydrographic surveyor un problema de salud de forma temprana y Lourena Simmonds a Product/process development scientist una cada. El diagnstico y tratamiento tempranos le brindan la mejor oportunidad de Chief Technology Officer las afecciones mdicas ms comunes en las personas mayores de 13 aos de edad.  Las cadas son la causa principal de las fracturas de huesos y lesiones en la cabeza de personas mayores de 67 aos de edad. Tome precauciones para evitar una cada en su casa.  Trabaje con el mdico para saber qu cambios que puede hacer para mejorar su salud y [place], y [place]. Esta informacin no tiene Hydrologist el consejo del mdico. Asegrese de hacerle al mdico cualquier pregunta que tenga. Document Revised: 02/15/2017 Document Reviewed: 02/15/2017 Elsevier Patient Education  2021 [employer].

## 2020-04-26 NOTE — Progress Notes (Addendum)
Subjective:    Patient ID: Gregory Cuevas, male    DOB: Oct 30, 1933, 87 y.o.   MRN: [id number]  HPI  Pt is a 87 yo male with Aortic insufficiency, ASD, cardiomegaly, HTN who presents to the clinic for yearly follow up. He presents with his wife and son.   Pt is doing great with no concerns. He sees cardiology every 6 months. Last visit 10/2019. No increasing problems with SOB or swelling.   .. Active Ambulatory Problems    Diagnosis Date Noted  . Malignant neoplasm of colon ([place]) 02/10/2010  . Hyperlipidemia 02/11/2010  . Cardiomegaly 01/19/2014  . Diastolic dysfunction 56/31/4970  . Mild aortic regurgitation 03/02/2014  . ASD (atrial septal defect) 03/02/2014  . Aortic insufficiency 03/02/2014  . Essential hypertension, benign 11/03/2014  . Acute depression 03/01/2015  . Acute anxiety 03/01/2015  . Constipation 03/18/2015  . Nocturia 03/18/2015  . Acute prostatitis 03/18/2015  . Adenomatous polyp of colon 08/04/2015  . Hyponatremia 06/22/2016  . Fracture of great toe, right, closed 07/25/2016  . Blind left eye 11/14/2016  . Vision changes 11/14/2016  . Chronic venous insufficiency 04/10/2017  . Bilateral lower extremity edema 04/10/2017  . Dizziness 04/10/2017  . History of retinal detachment 04/10/2017  . Benign prostatic hyperplasia without lower urinary tract symptoms 04/10/2017  . Weakness 08/20/2019   Resolved Ambulatory Problems    Diagnosis Date Noted  . Essential hypertension, benign 02/10/2010  . DARK URINE 02/10/2010  . Rhinorrhea 01/26/2014  . Nasal congestion 01/26/2014   Past Medical History:  Diagnosis Date  . AMI (acute myocardial infarction) ([place]) 1980  . Colon cancer ([place]) 1990's  . Detached retina   . Hypertension    .[name] Social History   Socioeconomic History  . Marital status: Married    Spouse name: Not on file  . Number of children: 2  . Years of education: Not on file  . Highest education level: Not on file  Occupational History  . Not on  file  Tobacco Use  . Smoking status: Never Smoker  . Smokeless tobacco: Never Used  Substance and Sexual Activity  . Alcohol use: Yes    Comment: Occasional  . Drug use: No  . Sexual activity: Not on file  Other Topics Concern  . Not on file  Social History Narrative  . Not on file   Social Determinants of Health   Financial Resource Strain: Not on file  Food Insecurity: Not on file  Transportation Needs: Not on file  Physical Activity: Not on file  Stress: Not on file  Social Connections: Not on file  Intimate Partner Violence: Not on file   .[name] Family History  Problem Relation Age of Onset  . Liver cancer Father        deceased  . Cancer Father        liver  . Cancer Brother        colon and esophageal  . Alzheimer's disease Other        2 sisters  . Colon cancer Other        2 brothers  . Esophageal cancer Other     Review of Systems  All other systems reviewed and are negative.      Objective:   Physical Exam BP 139/64   Pulse 74   Ht 5\' 4"  (1.626 m)   Wt 130 lb (59 kg)   SpO2 100%   BMI 22.31 kg/m   General Appearance:    Alert, cooperative, no distress,  appears stated age  Head:    Normocephalic, without obvious abnormality, atraumatic  Eyes:    PERRL, conjunctiva/corneas clear, EOM's intact on right eye. Left eye blind with no response to exam.      Ears:    Normal TM's and external ear canals, both ears  Nose:   Nares normal, septum midline, mucosa normal, no drainage    or sinus tenderness  Throat:   Lips, mucosa, and tongue normal; teeth and gums normal  Neck:   Supple, symmetrical, trachea midline, no adenopathy;       thyroid:  No enlargement/tenderness/nodules; no carotid   bruit or JVD  Back:     Symmetric, no curvature, ROM normal, no CVA tenderness  Lungs:     Clear to auscultation bilaterally, respirations unlabored  Chest wall:    No tenderness or deformity  Heart:    Regular rate and rhythm, S1 and S2 normal, 3/6 SEM.  Abdomen:      Soft, non-tender, bowel sounds active all four quadrants,    no masses, no organomegaly        Extremities:   Extremities normal, atraumatic, no cyanosis or edema  Pulses:   2+ and symmetric all extremities  Skin:   Skin color, texture, turgor normal, no rashes or lesions  Lymph nodes:   Cervical, supraclavicular, and axillary nodes normal  Neurologic:   CNII-XII intact. Normal strength, sensation and reflexes      throughout       .[name] Depression screen [facility] 2/9 04/26/2020 04/10/2017 07/07/2016 04/16/2013  Decreased Interest 1 0 0 0  Down, Depressed, Hopeless 0 0 0 0  PHQ - 2 Score 1 0 0 0  Altered sleeping 1 1 - -  Tired, decreased energy 1 0 - -  Change in appetite 0 3 - -  Feeling bad or failure about yourself  0 0 - -  Trouble concentrating 0 0 - -  Moving slowly or fidgety/restless 0 0 - -  Suicidal thoughts 0 0 - -  PHQ-9 Score 3 4 - -  Difficult doing work/chores Not difficult at all Not difficult at all - -   .[name] GAD 7 : Generalized Anxiety Score 04/26/2020 04/10/2017  Nervous, Anxious, on Edge 1 3  Control/stop worrying 1 3  Worry too much - different things 0 1  Trouble relaxing 0 0  Restless 1 0  Easily annoyed or irritable 0 0  Afraid - awful might happen 0 0  Total GAD 7 Score 3 7  Anxiety Difficulty Not difficult at all Not difficult at all    .[name]  [place] Name 04/26/20 1000         During the last Month   Sensation of Bladder not Empty Not at all     Urinate<2 hours after last Not at all     Mult. stop/start when voiding Less than 1 time in 5     Difficult to postpone voiding Not at all     Weak urinary stream Not at all     Push/strain to begin urination Not at all     Times per night up to urinate Less than 1 time in 5           OTHER   Total Score 2            Assessment & Plan:  Marland KitchenMarland KitchenDonyale was seen today for annual exam.  Diagnoses and all orders for this visit:  Routine physical examination -  COMPLETE METABOLIC PANEL WITH  GFR -     CBC with Differential/Platelet -     Lipid Panel w/reflex Direct LDL -     TSH  Pure hypercholesterolemia -     Lipid Panel w/reflex Direct LDL  Essential hypertension, benign -     COMPLETE METABOLIC PANEL WITH GFR -     lisinopril (ZESTRIL) 5 MG tablet; TAKE 1 TABLET(5 MG) BY MOUTH DAILY  Thyroid disorder screen -     TSH  Screening PSA (prostate specific antigen) -     PSA  ASD (atrial septal defect)  Cardiomegaly -     lisinopril (ZESTRIL) 5 MG tablet; TAKE 1 TABLET(5 MG) BY MOUTH DAILY  Nonrheumatic aortic valve insufficiency -     lisinopril (ZESTRIL) 5 MG tablet; TAKE 1 TABLET(5 MG) BY MOUTH DAILY  Mild aortic regurgitation -     lisinopril (ZESTRIL) 5 MG tablet; TAKE 1 TABLET(5 MG) BY MOUTH DAILY   .Marland KitchenStart a regular exercise program and make sure you are eating a healthy diet Try to eat 4 servings of dairy a day or take a calcium supplement (500mg  twice a day). Fasting labs ordered.  Colonoscopy not indicated.  PSA ordered.  AUA normal.  PHQ/GAD normal.  Discussed shingrix 2 dose series. Already has zoster. Pt aware to get at pharmacy.  Needs Tdap.  Get covid booster.   BP to goal. Refill lisinopril. Follow up in 6 months.

## 2020-04-27 ENCOUNTER — Encounter: Payer: Self-pay | Admitting: Physician Assistant

## 2020-04-27 LAB — COMPLETE METABOLIC PANEL WITH GFR
AG Ratio: 2.1 (calc) (ref 1.0–2.5)
ALT: 8 U/L — ABNORMAL LOW (ref 9–46)
AST: 15 U/L (ref 10–35)
Albumin: 4.1 g/dL (ref 3.6–5.1)
Alkaline phosphatase (APISO): 55 U/L (ref 35–144)
BUN: 16 mg/dL (ref 7–25)
CO2: 28 mmol/L (ref 20–32)
Calcium: 9.5 mg/dL (ref 8.6–10.3)
Chloride: 100 mmol/L (ref 98–110)
Creat: 1.07 mg/dL (ref 0.70–1.11)
GFR, Est African American: 73 mL/min/{1.73_m2} (ref >=60)
GFR, Est Non African American: 63 mL/min/{1.73_m2} (ref >=60)
Globulin: 2 g/dL (calc) (ref 1.9–3.7)
Glucose, Bld: 81 mg/dL (ref 65–99)
Potassium: 4.6 mmol/L (ref 3.5–5.3)
Sodium: 135 mmol/L (ref 135–146)
Total Bilirubin: 0.9 mg/dL (ref 0.2–1.2)
Total Protein: 6.1 g/dL (ref 6.1–8.1)

## 2020-04-27 LAB — CBC WITH DIFFERENTIAL/PLATELET
Absolute Monocytes: 505 cells/uL (ref 200–950)
Basophils Absolute: 31 cells/uL (ref 0–200)
Basophils Relative: 0.6 %
Eosinophils Absolute: 122 cells/uL (ref 15–500)
Eosinophils Relative: 2.4 %
HCT: 45.1 % (ref 38.5–50.0)
Hemoglobin: 14.4 g/dL (ref 13.2–17.1)
Lymphs Abs: 1612 cells/uL (ref 850–3900)
MCH: 28.7 pg (ref 27.0–33.0)
MCHC: 31.9 g/dL — ABNORMAL LOW (ref 32.0–36.0)
MCV: 90 fL (ref 80.0–100.0)
MPV: 9.8 fL (ref 7.5–12.5)
Monocytes Relative: 9.9 %
Neutro Abs: 2831 cells/uL (ref 1500–7800)
Neutrophils Relative %: 55.5 %
Platelets: 236 10*3/uL (ref 140–400)
RBC: 5.01 10*6/uL (ref 4.20–5.80)
RDW: 12.5 % (ref 11.0–15.0)
Total Lymphocyte: 31.6 %
WBC: 5.1 10*3/uL (ref 3.8–10.8)

## 2020-04-27 LAB — LIPID PANEL W/REFLEX DIRECT LDL
Cholesterol: 193 mg/dL (ref <200)
HDL: 77 mg/dL (ref >=40)
LDL Cholesterol (Calc): 97 mg/dL (calc)
Non-HDL Cholesterol (Calc): 116 mg/dL (calc) (ref <130)
Total CHOL/HDL Ratio: 2.5 (calc) (ref <5.0)
Triglycerides: 94 mg/dL (ref <150)

## 2020-04-27 LAB — PSA: PSA: 0.9 ng/mL (ref <=4.0)

## 2020-04-27 LAB — TSH: TSH: 0.93 mIU/L (ref 0.40–4.50)

## 2020-04-27 NOTE — Progress Notes (Signed)
Your labs look GREAT! Cholesterol wonderful. Sugar, kidney, liver, hemoglobin all good. PSA stayed stable.   So impressed!

## 2020-06-23 ENCOUNTER — Ambulatory Visit (INDEPENDENT_AMBULATORY_CARE_PROVIDER_SITE_OTHER): Payer: Medicare Other | Admitting: Physician Assistant

## 2020-06-23 DIAGNOSIS — Z0000 Encounter for general adult medical examination without abnormal findings: Secondary | ICD-10-CM | POA: Diagnosis not present

## 2020-06-23 NOTE — Patient Instructions (Addendum)
[place] Maintenance Summary and Written Plan of Care  Gregory Cuevas ,  Thank you for allowing me to perform your Medicare Annual Wellness Visit and for your ongoing commitment to your health.   Health Maintenance & Immunization History Health Maintenance  Topic Date Due  . COVID-19 Vaccine (2 - Booster for Janssen series) 07/09/2020 (Originally 05/23/2019)  . Zoster Vaccines- Shingrix (1 of 2) 09/23/2020 (Originally 05/01/1985)  . TETANUS/TDAP  06/23/2021 (Originally 03/16/2020)  . INFLUENZA VACCINE  08/16/2020  . PNA vac Low Risk Adult  Completed  . Pneumococcal Vaccine 50-47 Years old  Aged Out  . HPV VACCINES  Aged Out   Immunization History  Administered Date(s) Administered  . Influenza,inj,Quad PF,6+ Mos 12/25/2012, 11/03/2014, 11/14/2016  . Janssen (J&J) SARS-COV-2 Vaccination 03/28/2019  . Pneumococcal Conjugate-13 11/14/2016  . Pneumococcal Polysaccharide-23 03/17/2010  . Td 03/17/2010  . Zoster, Live 12/25/2012    These are the patient goals that we discussed: Goals Addressed              This Visit's Progress   .  Patient Stated (pt-stated)        06/23/2020 AWV Goal: Improved Nutrition/Diet  . Patient will verbalize understanding that diet plays an important role in overall health and that a poor diet is a risk factor for many chronic medical conditions.  . Over the next year, patient will improve self management of their diet by incorporating more water. . Patient will utilize available community resources to help with food acquisition if needed (ex: food pantries, [address], etc) . Patient will work with nutrition specialist if a referral was made         This is a list of Health Maintenance Items that are overdue or due now: Td vaccine Shingrix vaccine  Orders/Referrals Placed Today: No orders of the defined types were placed in this encounter.  (Contact our referral department at [phone] if you have not spoken with someone  about your referral appointment within the next 5 days)    Follow-up Plan . Follow-up with Donella Stade, PA-C as planned . Schedule your tetanus and shingrix vaccine at your pharmacy. . Medicare wellness in one year. . AVS printed and mailed to the patient.  Health Maintenance, Male Adopting a healthy lifestyle and getting preventive care are important in promoting health and wellness. Ask your health care provider about:  The right schedule for you to have regular tests and exams.  Things you can do on your own to prevent diseases and keep yourself healthy. What should I know about diet, weight, and exercise? Eat a healthy diet  Eat a diet that includes plenty of vegetables, fruits, low-fat dairy products, and lean protein.  Do not eat a lot of foods that are high in solid fats, added sugars, or sodium.   Maintain a healthy weight Body mass index (BMI) is a measurement that can be used to identify possible weight problems. It estimates body fat based on height and weight. Your health care provider can help determine your BMI and help you achieve or maintain a healthy weight. Get regular exercise Get regular exercise. This is one of the most important things you can do for your health. Most adults should:  Exercise for at least 150 minutes each week. The exercise should increase your heart rate and make you sweat (moderate-intensity exercise).  Do strengthening exercises at least twice a week. This is in addition to the moderate-intensity exercise.  Spend less time sitting.  Even light physical activity can be beneficial. Watch cholesterol and blood lipids Have your blood tested for lipids and cholesterol at 85 years of age, then have this test every 5 years. You may need to have your cholesterol levels checked more often if:  Your lipid or cholesterol levels are high.  You are older than 85 years of age.  You are at high risk for heart disease. What should I know about  cancer screening? Many types of cancers can be detected early and may often be prevented. Depending on your health history and family history, you may need to have cancer screening at various ages. This may include screening for:  Colorectal cancer.  Prostate cancer.  Skin cancer.  Lung cancer. What should I know about heart disease, diabetes, and high blood pressure? Blood pressure and heart disease  High blood pressure causes heart disease and increases the risk of stroke. This is more likely to develop in people who have high blood pressure readings, are of African descent, or are overweight.  Talk with your health care provider about your target blood pressure readings.  Have your blood pressure checked: ? Every 3-5 years if you are 34-59 years of age. ? Every year if you are 41 years old or older.  If you are between the ages of 60 and 11 and are a current or former smoker, ask your health care provider if you should have a one-time screening for abdominal aortic aneurysm (AAA). Diabetes Have regular diabetes screenings. This checks your fasting blood sugar level. Have the screening done:  Once every three years after age 56 if you are at a normal weight and have a low risk for diabetes.  More often and at a younger age if you are overweight or have a high risk for diabetes. What should I know about preventing infection? Hepatitis B If you have a higher risk for hepatitis B, you should be screened for this virus. Talk with your health care provider to find out if you are at risk for hepatitis B infection. Hepatitis C Blood testing is recommended for:  Everyone born from [birth year] through 1965.  Anyone with known risk factors for hepatitis C. Sexually transmitted infections (STIs)  You should be screened each year for STIs, including gonorrhea and chlamydia, if: ? You are sexually active and are younger than 85 years of age. ? You are older than 85 years of age and your health  care provider tells you that you are at risk for this type of infection. ? Your sexual activity has changed since you were last screened, and you are at increased risk for chlamydia or gonorrhea. Ask your health care provider if you are at risk.  Ask your health care provider about whether you are at high risk for HIV. Your health care provider may recommend a prescription medicine to help prevent HIV infection. If you choose to take medicine to prevent HIV, you should first get tested for HIV. You should then be tested every 3 months for as long as you are taking the medicine. Follow these instructions at home: Lifestyle  Do not use any products that contain nicotine or tobacco, such as cigarettes, e-cigarettes, and chewing tobacco. If you need help quitting, ask your health care provider.  Do not use street drugs.  Do not share needles.  Ask your health care provider for help if you need support or information about quitting drugs. Alcohol use  Do not drink alcohol if your health care  provider tells you not to drink.  If you drink alcohol: ? Limit how much you have to 0-2 drinks a day. ? Be aware of how much alcohol is in your drink. In the [place] one drink equals one 12 oz bottle of beer (355 mL), one 5 oz glass of wine (148 mL), or one 1 oz glass of hard liquor (44 mL). General instructions  Schedule regular health, dental, and eye exams.  Stay current with your vaccines.  Tell your health care provider if: ? You often feel depressed. ? You have ever been abused or do not feel safe at home. Summary  Adopting a healthy lifestyle and getting preventive care are important in promoting health and wellness.  Follow your health care provider's instructions about healthy diet, exercising, and getting tested or screened for diseases.  Follow your health care provider's instructions on monitoring your cholesterol and blood pressure. This information is not intended to replace advice  given to you by your health care provider. Make sure you discuss any questions you have with your health care provider. Document Revised: 12/26/2017 Document Reviewed: 12/26/2017 Elsevier Patient Education  2021 [employer].

## 2020-06-23 NOTE — Progress Notes (Signed)
MEDICARE ANNUAL WELLNESS VISIT  06/23/2020  Telephone Visit Disclaimer This Medicare AWV was conducted by telephone due to national recommendations for restrictions regarding the COVID-19 Pandemic (e.g. social distancing).  I verified, using two identifiers, that I am speaking with Gregory Cuevas or their authorized healthcare agent. I discussed the limitations, risks, security, and privacy concerns of performing an evaluation and management service by telephone and the potential availability of an in-person appointment in the future. The patient expressed understanding and agreed to proceed.  Location of Patient: Home  Location of Provider (nurse):  Provider home  Subjective:    Gregory Cuevas is a 87 y.o. male patient of Breeback, Royetta Car, PA-C who had a [employer] Visit today via telephone. Gregory Cuevas is Retired and lives with their spouse and son. he has 2 children. he reports that he is socially active and does interact with friends/family regularly. he is minimally physically active and enjoys yard work, Print production planner and the news on television.  Patient Care Team: Lavada Mesi as PCP - General (Family Medicine)  Advanced Directives 06/23/2020 07/14/2013  Does Patient Have a Medical Advance Directive? Yes Patient has advance directive, copy not in chart  Type of Advance Directive Living will Living will  Does patient want to make changes to medical advance directive? No - Patient declined [facility] Utilization Over the Past 12 Months: # of hospitalizations or ER visits: 0 # of surgeries: 0  Review of Systems    Patient reports that his overall health is unchanged compared to last year.  History obtained from chart review and the patient  Patient Reported Readings (BP, Pulse, CBG, Weight, etc) none  Pain Assessment Pain : No/denies pain     Current Medications & Allergies (verified) Allergies as of 06/23/2020   No Known Allergies     Medication List        Accurate as of June 23, 2020  2:10 PM. If you have any questions, ask your nurse or doctor.        CALCIUM-MAGNESIUM-ZINC PO Take by mouth daily.   lisinopril 5 MG tablet Commonly known as: ZESTRIL TAKE 1 TABLET(5 MG) BY MOUTH DAILY   pravastatin 40 MG tablet Commonly known as: PRAVACHOL TAKE 1 TABLET(40 MG) BY MOUTH DAILY   vitamin B-12 1000 MCG tablet Commonly known as: CYANOCOBALAMIN Take 1,000 mcg by mouth daily.   VITAMIN D PO Take 125 mg by mouth daily.       History (reviewed): Past Medical History:  Diagnosis Date  . AMI (acute myocardial infarction) ([place]) 1980  . Colon cancer ([place]) 1990's  . Detached retina   . Hyperlipidemia   . Hypertension    Past Surgical History:  Procedure Laterality Date  . EYE SURGERY    . PARTIAL COLECTOMY     Family History  Problem Relation Age of Onset  . Liver cancer Father        deceased  . Cancer Father        liver  . Cancer Brother        colon and esophageal  . Alzheimer's disease Other        2 sisters  . Colon cancer Other        2 brothers  . Esophageal cancer Other    Social History   Socioeconomic History  . Marital status: Married    Spouse name: Verdis Frederickson  . Number of children: 2  . Years of education: 6  .  Highest education level: 6th grade  Occupational History    Comment: Retired  Tobacco Use  . Smoking status: Never Smoker  . Smokeless tobacco: Never Used  Substance and Sexual Activity  . Alcohol use: Yes    Comment: Occasional  . Drug use: No  . Sexual activity: Not on file  Other Topics Concern  . Not on file  Social History Narrative   Lives with wife and son. He enjoys yardwork, watching soccer and news on TV.   Social Determinants of Health   Financial Resource Strain: Low Risk   . Difficulty of Paying Living Expenses: Not hard at all  Food Insecurity: No Food Insecurity  . Worried About Charity fundraiser in the Last Year: Never true  . Ran Out of Food in the Last Year: Never  true  Transportation Needs: No Transportation Needs  . Lack of Transportation (Medical): No  . Lack of Transportation (Non-Medical): No  Physical Activity: Inactive  . Days of Exercise per Week: 0 days  . Minutes of Exercise per Session: 0 min  Stress: No Stress Concern Present  . Feeling of Stress : Not at all  Social Connections: Moderately Isolated  . Frequency of Communication with Friends and Family: More than three times a week  . Frequency of Social Gatherings with Friends and Family: More than three times a week  . Attends Religious Services: Never  . Active Member of Clubs or Organizations: No  . Attends Archivist Meetings: Never  . Marital Status: Married    Activities of Daily Living In your present state of health, do you have any difficulty performing the following activities: 06/23/2020 08/20/2019  Hearing? N N  Vision? Y N  Comment he is legally blind in one eye and the other eye he has had cataract surgery in 2021 -  Difficulty concentrating or making decisions? Y N  Comment some memory issues -  Walking or climbing stairs? N Y  Dressing or bathing? N N  Doing errands, shopping? Y N  Comment he doesn't drive; his son takes him to the doctor/grocery shopping -  Conservation officer, nature and eating ? N -  Using the Toilet? N -  In the past six months, have you accidently leaked urine? N -  Do you have problems with loss of bowel control? N -  Managing your Medications? N -  Managing your Finances? N -  Housekeeping or managing your Housekeeping? N -  Some recent data might be hidden    Patient Education/ Literacy How often do you need to have someone help you when you read instructions, pamphlets, or other written materials from your doctor or pharmacy?: 4 - Often (his son usually helps if he doesn't understand anything) What is the last grade level you completed in school?: 6th grade  Exercise Current Exercise Habits: Home exercise routine, Type of exercise:  walking, Time (Minutes): 10, Frequency (Times/Week): 5, Weekly Exercise (Minutes/Week): 50, Intensity: Mild, Exercise limited by: None identified  Diet Patient reports consuming 2 meals a day and 1 snack(s) a day Patient reports that his primary diet is: Regular Patient reports that she does not have regular access to food.   Depression Screen PHQ 2/9 Scores 06/23/2020 04/26/2020 04/10/2017 07/07/2016 04/16/2013  PHQ - 2 Score 1 1 0 0 0  PHQ- 9 Score 1 3 4  - -     Fall Risk Fall Risk  06/23/2020 04/26/2020 08/20/2019 07/07/2016 04/16/2013  Falls in the past year? 0 0 0  No No  Number falls in past yr: 0 0 - - -  Injury with Fall? 0 0 - - -  Risk for fall due to : No Fall Risks - No Fall Risks - -  Follow up Falls evaluation completed Falls evaluation completed Falls evaluation completed - -     Objective:  Gregory Cuevas seemed alert and oriented and he participated appropriately during our telephone visit.  Blood Pressure Weight BMI  BP Readings from Last 3 Encounters:  04/26/20 139/64  11/05/19 (!) 142/88  08/20/19 140/74   Wt Readings from Last 3 Encounters:  04/26/20 130 lb (59 kg)  11/05/19 132 lb (59.9 kg)  08/20/19 135 lb (61.2 kg)   BMI Readings from Last 1 Encounters:  04/26/20 22.31 kg/m    *Unable to obtain current vital signs, weight, and BMI due to telephone visit type  Hearing/Vision  . Gregory Cuevas did not seem to have difficulty with hearing/understanding during the telephone conversation . Reports that he has not had a formal eye exam by an eye care professional within the past year . Reports that he has not had a formal hearing evaluation within the past year *Unable to fully assess hearing and vision during telephone visit type  Cognitive Function: 6CIT Screen 06/23/2020 11/14/2016  What Year? 0 points 0 points  What month? 0 points 0 points  What time? 0 points 0 points  Count back from 20 2 points 0 points  Months in reverse 4 points 2 points  Repeat phrase 10 points 2 points   Total Score 16 4   (Normal:0-7, Significant for Dysfunction: >8)  Normal Cognitive Function Screening: Yes   Immunization & Health Maintenance Record Immunization History  Administered Date(s) Administered  . Influenza,inj,Quad PF,6+ Mos 12/25/2012, 11/03/2014, 11/14/2016  . Janssen (J&J) SARS-COV-2 Vaccination 03/28/2019  . Pneumococcal Conjugate-13 11/14/2016  . Pneumococcal Polysaccharide-23 03/17/2010  . Td 03/17/2010  . Zoster, Live 12/25/2012    Health Maintenance  Topic Date Due  . COVID-19 Vaccine (2 - Booster for Janssen series) 07/09/2020 (Originally 05/23/2019)  . Zoster Vaccines- Shingrix (1 of 2) 09/23/2020 (Originally 05/01/1985)  . TETANUS/TDAP  06/23/2021 (Originally 03/16/2020)  . INFLUENZA VACCINE  08/16/2020  . PNA vac Low Risk Adult  Completed  . Pneumococcal Vaccine 60-74 Years old  Aged Out  . HPV VACCINES  Aged Out       Assessment  This is a routine wellness examination for Gregory Cuevas.  Health Maintenance: Due or Overdue There are no preventive care reminders to display for this patient.  Gregory Cuevas does not need a referral for Community Assistance: Care Management:   no Social Work:    no Prescription Assistance:  no Nutrition/Diabetes Education:  no   Plan:  Personalized Goals Goals Addressed              This Visit's Progress   .  Patient Stated (pt-stated)        06/23/2020 AWV Goal: Improved Nutrition/Diet  . Patient will verbalize understanding that diet plays an important role in overall health and that a poor diet is a risk factor for many chronic medical conditions.  . Over the next year, patient will improve self management of their diet by incorporating more water. . Patient will utilize available community resources to help with food acquisition if needed (ex: food pantries, [address], etc) . Patient will work with nutrition specialist if a referral was made       Personalized Health Maintenance & Screening Recommendations  Td  vaccine Shingrix vaccine  Lung Cancer Screening Recommended: no (Low Dose CT Chest recommended if Age 52-80 years, 30 pack-year currently smoking OR have quit w/in past 15 years) Hepatitis C Screening recommended: no HIV Screening recommended: no  Advanced Directives: Written information was not prepared per patient's request.  Referrals & Orders No orders of the defined types were placed in this encounter.   Follow-up Plan . Follow-up with Donella Stade, PA-C as planned . Schedule your tetanus and shingrix vaccine at your pharmacy. . Medicare wellness in one year. . AVS printed and mailed to the patient.   I have personally reviewed and noted the following in the patient's chart:   . Medical and social history . Use of alcohol, tobacco or illicit drugs  . Current medications and supplements . Functional ability and status . Nutritional status . Physical activity . Advanced directives . List of other physicians . Hospitalizations, surgeries, and ER visits in previous 12 months . Vitals . Screenings to include cognitive, depression, and falls . Referrals and appointments  In addition, I have reviewed and discussed with Gregory Cuevas certain preventive protocols, quality metrics, and best practice recommendations. A written personalized care plan for preventive services as well as general preventive health recommendations is available and can be mailed to the patient at his request.      Tinnie Gens, RN  06/23/2020

## 2020-08-03 ENCOUNTER — Other Ambulatory Visit: Payer: Self-pay

## 2020-08-03 ENCOUNTER — Ambulatory Visit (INDEPENDENT_AMBULATORY_CARE_PROVIDER_SITE_OTHER): Payer: Medicare Other | Admitting: Physician Assistant

## 2020-08-03 ENCOUNTER — Encounter: Payer: Self-pay | Admitting: Physician Assistant

## 2020-08-03 VITALS — BP 137/49 | HR 62 | Ht 64.0 in | Wt 126.0 lb

## 2020-08-03 DIAGNOSIS — K4090 Unilateral inguinal hernia, without obstruction or gangrene, not specified as recurrent: Secondary | ICD-10-CM | POA: Insufficient documentation

## 2020-08-03 NOTE — Progress Notes (Signed)
Acute Office Visit  Subjective:    Patient ID: Gregory Cuevas, male    DOB: 08/10/1933, 87 y.o.   MRN: [id number]  Chief Complaint  Patient presents with   Groin Pain    Groin Pain The patient's pertinent negatives include no scrotal swelling or testicular pain. Pertinent negatives include no dysuria.  Patient is in today for right groin pain/bulge  Patient is accompanied by his son, Gregory Cuevas, who is acting as interpreter. Patient declined [employer] interpreter  He reports for about 1.5 months he has noted a waxing and waning bulge in his right inguinal area. Area bulges and becomes tender with doing yardwork. He reports remote history of hernia repair on the same side. Denies fever, vomiting, persistent pain, change in bowel habits.   Past Medical History:  Diagnosis Date   AMI (acute myocardial infarction) ([place]) 1980   Colon cancer ([place]) 72's   Detached retina    Hyperlipidemia    Hypertension     Past Surgical History:  Procedure Laterality Date   EYE SURGERY     PARTIAL COLECTOMY      Family History  Problem Relation Age of Onset   Liver cancer Father        deceased   Cancer Father        liver   Cancer Brother        colon and esophageal   Alzheimer's disease Other        2 sisters   Colon cancer Other        2 brothers   Esophageal cancer Other     Social History   Socioeconomic History   Marital status: Married    Spouse name: Verdis Frederickson   Number of children: 2   Years of education: 6   Highest education level: 6th grade  Occupational History    Comment: Retired  Tobacco Use   Smoking status: Never   Smokeless tobacco: Never  Substance and Sexual Activity   Alcohol use: Yes    Comment: Occasional   Drug use: No   Sexual activity: Not on file  Other Topics Concern   Not on file  Social History Narrative   Lives with wife and son. He enjoys yardwork, watching soccer and news on TV.   Social Determinants of Health   Financial Resource Strain: Low  Risk    Difficulty of Paying Living Expenses: Not hard at all  Food Insecurity: No Food Insecurity   Worried About Charity fundraiser in the Last Year: Never true   Bud in the Last Year: Never true  Transportation Needs: No Transportation Needs   Lack of Transportation (Medical): No   Lack of Transportation (Non-Medical): No  Physical Activity: Inactive   Days of Exercise per Week: 0 days   Minutes of Exercise per Session: 0 min  Stress: No Stress Concern Present   Feeling of Stress : Not at all  Social Connections: Moderately Isolated   Frequency of Communication with Friends and Family: More than three times a week   Frequency of Social Gatherings with Friends and Family: More than three times a week   Attends Religious Services: Never   Marine scientist or Organizations: No   Attends Archivist Meetings: Never   Marital Status: Married  Human resources officer Violence: Not At Risk   Fear of Current or Ex-Partner: No   Emotionally Abused: No   Physically Abused: No   Sexually Abused: No  Outpatient Medications Prior to Visit  Medication Sig Dispense Refill   CALCIUM-MAGNESIUM-ZINC PO Take by mouth daily.     lisinopril (ZESTRIL) 5 MG tablet TAKE 1 TABLET(5 MG) BY MOUTH DAILY 90 tablet 1   pravastatin (PRAVACHOL) 40 MG tablet TAKE 1 TABLET(40 MG) BY MOUTH DAILY 90 tablet 1   vitamin B-12 (CYANOCOBALAMIN) 1000 MCG tablet Take 1,000 mcg by mouth daily.     VITAMIN D PO Take 125 mg by mouth daily.     No facility-administered medications prior to visit.    No Known Allergies  Review of Systems  Genitourinary:  Negative for difficulty urinating, dysuria, penile swelling, scrotal swelling and testicular pain.  Musculoskeletal:  Positive for gait problem (occasional limping).       Intermittent R groin pain  All other systems reviewed and are negative.     Objective:    Physical Exam Vitals reviewed. Exam conducted with a chaperone present.   Constitutional:      Appearance: Normal appearance. He is not ill-appearing or toxic-appearing.  Pulmonary:     Effort: Pulmonary effort is normal.  Abdominal:     General: Abdomen is flat.     Palpations: Abdomen is soft.     Tenderness: There is abdominal tenderness in the suprapubic area. There is no guarding.     Hernia: A hernia is present. Hernia is present in the right inguinal area.  Neurological:     Mental Status: He is alert.     Gait: Gait normal.  Psychiatric:        Mood and Affect: Mood normal.        Behavior: Behavior normal.        Thought Content: Thought content normal.    BP (!) 137/49   Pulse 62   Ht 5\' 4"  (1.626 m)   Wt 126 lb (57.2 kg)   SpO2 97%   BMI 21.63 kg/m  Wt Readings from Last 3 Encounters:  08/03/20 126 lb (57.2 kg)  04/26/20 130 lb (59 kg)  11/05/19 132 lb (59.9 kg)    There are no preventive care reminders to display for this patient.  There are no preventive care reminders to display for this patient.   Lab Results  Component Value Date   TSH 0.93 04/26/2020   Lab Results  Component Value Date   WBC 5.1 04/26/2020   HGB 14.4 04/26/2020   HCT 45.1 04/26/2020   MCV 90.0 04/26/2020   PLT 236 04/26/2020   Lab Results  Component Value Date   NA 135 04/26/2020   K 4.6 04/26/2020   CO2 28 04/26/2020   GLUCOSE 81 04/26/2020   BUN 16 04/26/2020   CREATININE 1.07 04/26/2020   BILITOT 0.9 04/26/2020   ALKPHOS 68 06/21/2016   AST 15 04/26/2020   ALT 8 (L) 04/26/2020   PROT 6.1 04/26/2020   ALBUMIN 4.2 06/21/2016   CALCIUM 9.5 04/26/2020   Lab Results  Component Value Date   CHOL 193 04/26/2020   Lab Results  Component Value Date   HDL 77 04/26/2020   Lab Results  Component Value Date   LDLCALC 97 04/26/2020   Lab Results  Component Value Date   TRIG 94 04/26/2020   Lab Results  Component Value Date   CHOLHDL 2.5 04/26/2020   Lab Results  Component Value Date   HGBA1C 5.5 04/10/2017       Assessment &  Plan:   Problem List Items Addressed This Visit  Other   Inguinal hernia, right - Primary   Relevant Orders   Ambulatory referral to General Surgery   Urgent referral placed to Gen Surg Counseled patient and his son on ER precautions to include persistent pain, severe pain, vomiting or change in bowel habits Advised no lifting anything >1lb and no strenuous activity including yard work  No orders of the defined types were placed in this encounter.    Trixie Dredge, [place]

## 2020-08-03 NOTE — Patient Instructions (Signed)
Hernia inguinal en los adultos Inguinal Hernia, Adult Una hernia inguinal se produce cuando la grasa o los intestinos empujan a travs de una zona dbil de un msculo donde se unen la pierna y el abdomen inferior (ingle). Esto crea un bulto. Este tipo de hernia tambin puede aparecer en: El escroto, si es varn. Los pliegues de la piel alrededor de la vagina, si es [place]. Existen tres tipos de hernias inguinales: Hernias que se pueden empujar hacia adentro del abdomen (son reducibles). Este tipo de hernias rara vez provoca dolor. Hernias que no son reducibles (estn encarceladas). Hernias que no son reducibles y pierden la irrigacin sangunea (estn estranguladas). Este tipo de hernia requiere ciruga de Freight forwarder. Cules son las causas? Esta afeccin se produce cuando tiene una zona dbil en los msculos o en los tejidos de la ingle. Esto se desarrolla con el tiempo. La hernia puede salirse por la zona dbil cuando ejerce un esfuerzo repentino [employer] abdominales inferiores, por ejemplo, en los siguientes casos: Levanta un objeto pesado. Hace esfuerzos para defecar. El estreimiento puede llevar a Materials engineer un gran esfuerzo. Tos. Qu incrementa el riesgo? Es ms probable que Orthoptist en: Los hombres. [place]. Personas que: Tienen sobrepeso. Realizan trabajos que requieren Haematologist de pie durante largos perodos o levantar cargas pesadas. Han tenido una hernia inguinal previamente. Fuman o tienen una enfermedad pulmonar. Estos factores pueden causar tos a largo plazo (crnica). Cules son los signos o sntomas? Los sntomas pueden depender del tamao de la hernia. Con frecuencia, una pequea hernia inguinal no tiene sntomas. Estos son algunos de los sntomas de una hernia ms grande: Un bulto en la zona de la ingle. Este es fcil de ver cuando se encuentra de pie. Podra no ser visible si se encuentra acostado. Dolor o ardor en la  ingle. Esto podra empeorar cuando levanta un objeto, realiza un esfuerzo o tose. Un dolor sordo o una sensacin de presin en la ingle. Un bulto inusual en el escroto, en los hombres. Los sntomas de una hernia inguinal estrangulada pueden incluir lo siguiente: Un bulto en la ingle que es muy doloroso y sensible al tacto. Un bulto que se torna de color rojo o prpura. Cristy Hilts, nuseas y vmitos. Imposibilidad de defecar o eliminar gases. Cmo se diagnostica? Esta afeccin se diagnostica en funcin de sus sntomas, antecedentes mdicos y de un examen fsico. El mdico puede examinar la zona de la ingle y pedirle [place]. Cmo se trata? El tratamiento depende del tamao de la hernia y de si tiene sntomas o no. Si no tiene sntomas, el mdico podr indicarle que controle cuidadosamente la hernia y que concurra a las visitas de seguimiento. Si tiene sntomas o [place] grande, es posible que necesite ciruga para repararla. Siga estas instrucciones en su casa: Estilo de vida Evite levantar objetos pesados. Intente no estar de pie [employer]. No consuma ningn producto que contenga nicotina o tabaco. Estos productos incluyen cigarrillos, tabaco para Higher education careers adviser y aparatos de vapeo, como los Psychologist, sport and exercise. Si necesita ayuda para dejar de fumar, consulte al mdico. Mantenga un peso saludable. Prevencin del estreimiento Es posible que tenga que tomar estas medidas para prevenir o tratar el estreimiento: Electronics engineer suficiente lquido como para Theatre manager la orina de color [place]arillo plido. Usar medicamentos recetados o de Radio broadcast assistant. Consumir alimentos ricos en fibra, como frijoles, cereales integrales, y frutas y verduras frescas. Limitar el consumo de alimentos ricos en grasa y azcares procesados, como los alimentos  fritos o dulces. Instrucciones generales Puede intentar colocar la hernia en su lugar ejerciendo sobre esta una presin muy suave mientras est acostado. No intente  forzar el bulto hacia adentro nuevamente si este no entra fcilmente. Observe si la hernia cambia de forma, de color o de tamao. Solicite ayuda de inmediato si observa algn cambio. Use los medicamentos de venta libre y los recetados solamente como se lo haya indicado el mdico. Cumpla con todas las visitas de seguimiento. Esto es importante. Comunquese con un mdico si: Tiene fiebre o escalofros. Tiene nuevos sntomas. Sus sntomas empeoran. Solicite ayuda de inmediato si: Tiene dolor en la ingle que comienza repentinamente y Kim. Tiene un bulto en la ingle que tiene las siguientes caractersticas: Se agranda de repente y no se encoge. Se vuelve rojo o prpura y causa dolor al tacto. Si es hombre y tiene Clinical cytogeneticist repentino en el escroto o el tamao de este cambia de repente. No puede volver a Public affairs consultant hernia en su lugar al ejercer sobre esta una presin muy suave mientras est acostado. Tiene nuseas o vmitos que no desaparecen. Tiene latidos cardacos acelerados. Tiene dificultad para defecar o eliminar gases. Estos sntomas pueden representar un problema grave que constituye Engineer, maintenance (IT). No espere a ver si los sntomas desaparecen. Solicite atencin mdica de inmediato. Comunquese con el servicio de emergencias de su localidad (911 en los [place]). Resumen Una hernia inguinal se produce cuando la grasa o los intestinos empujan a travs de una zona dbil de un msculo donde se unen la pierna y el abdomen inferior (ingle). Esta afeccin ocurre cuando tiene una zona dbil en los msculos o en los tejidos de la ingle. Los sntomas pueden depender del tamao de la hernia, y pueden incluir dolor o hinchazn de la ingle. Con frecuencia, una pequea hernia inguinal no tiene sntomas. Si no tiene sntomas, es posible que no necesite tratamiento. Si tiene sntomas o una hernia grande, es posible que necesite ciruga para reparar la hernia. Evite levantar objetos pesados. Tambin  intente no estar de pie [employer]. Esta informacin no tiene Marine scientist el consejo del mdico. Asegresede hacerle al mdico cualquier pregunta que tenga. Document Revised: 11/03/2019 Document Reviewed: 09/19/2019 Elsevier Patient Education  2022 [employer].

## 2020-08-18 ENCOUNTER — Other Ambulatory Visit: Payer: Self-pay | Admitting: Physician Assistant

## 2020-08-25 ENCOUNTER — Telehealth: Payer: Self-pay

## 2020-08-25 DIAGNOSIS — I351 Nonrheumatic aortic (valve) insufficiency: Secondary | ICD-10-CM | POA: Diagnosis not present

## 2020-08-25 DIAGNOSIS — K4091 Unilateral inguinal hernia, without obstruction or gangrene, recurrent: Secondary | ICD-10-CM | POA: Diagnosis not present

## 2020-08-25 NOTE — Telephone Encounter (Signed)
Primary Cardiologist:Brian Stanford Breed, MD  Chart reviewed as part of pre-operative protocol coverage. Because of Gregory Cuevas's past medical history and time since last visit, he/she will require a follow-up visit in order to better assess preoperative cardiovascular risk.  Pre-op covering staff: - Please schedule appointment and call patient to inform them. - Please contact requesting surgeon's office via preferred method (i.e, phone, fax) to inform them of need for appointment prior to surgery.  If applicable, this message will also be routed to pharmacy pool and/or primary cardiologist for input on holding anticoagulant/antiplatelet agent as requested below so that this information is available at time of patient's appointment.   Deberah Pelton, NP  08/25/2020, 4:32 PM

## 2020-08-25 NOTE — Telephone Encounter (Signed)
Son called today to see about a surgical clearance for his father who is a patient of Dr. Stanford Breed. He was advised it could take up to 10 days and he may need to be seen first before he can be approved for his surgery. Please return sons call and advise on what his next steps should be. [phone]) is the number given to reach him.

## 2020-08-25 NOTE — Telephone Encounter (Signed)
   [facility] Pre-operative Risk Assessment    Patient Name: Gregory Cuevas  DOB: 14-Feb-1933 MRN: [id number]   Request for surgical clearance:  What type of surgery is being performed Right inguinal hernia repair  When is this surgery scheduled TBD  What type of clearance is required Medical  Are there any medications that need to be held prior to surgery and how long  None   Practice name and name of physician performing surgery  [facility]  Dr.Eric Redmond Pulling   What is the office phone number  [phone]   7.   What is the office fax number        [phone]  8.   Anesthesia type General   Kathyrn Lass 08/25/2020, 4:21 PM  _________________________________________________________________   (provider comments below)

## 2020-08-26 ENCOUNTER — Other Ambulatory Visit: Payer: Self-pay

## 2020-08-26 ENCOUNTER — Ambulatory Visit: Payer: Medicare Other | Admitting: Physician Assistant

## 2020-08-26 ENCOUNTER — Encounter: Payer: Self-pay | Admitting: Physician Assistant

## 2020-08-26 VITALS — BP 128/60 | HR 63 | Ht 64.0 in | Wt 123.6 lb

## 2020-08-26 DIAGNOSIS — I351 Nonrheumatic aortic (valve) insufficiency: Secondary | ICD-10-CM

## 2020-08-26 DIAGNOSIS — Z01810 Encounter for preprocedural cardiovascular examination: Secondary | ICD-10-CM | POA: Diagnosis not present

## 2020-08-26 DIAGNOSIS — I10 Essential (primary) hypertension: Secondary | ICD-10-CM

## 2020-08-26 DIAGNOSIS — Q211 Atrial septal defect, unspecified: Secondary | ICD-10-CM

## 2020-08-26 DIAGNOSIS — Z01818 Encounter for other preprocedural examination: Secondary | ICD-10-CM

## 2020-08-26 NOTE — Patient Instructions (Addendum)
Medication Instructions:  Your physician recommends that you continue on your current medications as directed. Please refer to the Current Medication list given to you today.  *If you need a refill on your cardiac medications before your next appointment, please call your pharmacy*   Lab Work: None ordered  If you have labs (blood work) drawn today and your tests are completely normal, you will receive your results only by: [place] (if you have MyChart) OR A paper copy in the mail If you have any lab test that is abnormal or we need to change your treatment, we will call you to review the results.   Testing/Procedures: Your physician has requested that you have an echocardiogram. Echocardiography is a painless test that uses sound waves to create images of your heart. It provides your doctor with information about the size and shape of your heart and how well your heart's chambers and valves are working. This procedure takes approximately one hour. There are no restrictions for this procedure.    Follow-Up: At [facility], you and your health needs are our priority.  As part of our continuing mission to provide you with exceptional heart care, we have created designated Provider Care Teams.  These Care Teams include your primary Cardiologist (physician) and Advanced Practice Providers (APPs -  Physician Assistants and Nurse Practitioners) who all work together to provide you with the care you need, when you need it.  We recommend signing up for the patient portal called "MyChart".  Sign up information is provided on this After Visit Summary.  MyChart is used to connect with patients for Virtual Visits (Telemedicine).  Patients are able to view lab/test results, encounter notes, upcoming appointments, etc.  Non-urgent messages can be sent to your provider as well.   To learn more about what you can do with MyChart, go to [email or web address].    Your next appointment:   Based  upon the testing  The format for your next appointment:   In Person  Provider:   Dr. Kirk Ruths   Other Instructions

## 2020-08-26 NOTE — Telephone Encounter (Signed)
Pt is scheduled to see Vin Bhagat, PA-C, today 2:15 for surgical clearance.  Will route back to the surgeon's office to make them aware.

## 2020-08-26 NOTE — Progress Notes (Addendum)
Cardiology Office Note:    Date:  08/26/2020   ID:  Gregory Cuevas, DOB Jun 13, 1933, MRN [id number]  PCP:  Lavada Mesi  [facility] Cardiologist:  Kirk Ruths, MD  [place] Electrophysiologist:  None   Chief Complaint: surgical clearance for right inguinal hernia repair  History of Present Illness:    Gregory Cuevas is a 87 y.o. male with a hx of aortic insufficiency, hypertension and hyperlipidemia seen for surgical clearance.  Echocardiogram August 2021 showed LV function of 50 to 55%, no regional wall motion abnormality, diastolic dysfunction, severe AI based on vena contracta, though moderate by PHT.   Establish cardiac care with Dr. Stanford Breed October 2021 for aortic insufficiency.  Patient was told to have heart attack at age 75 but no documentation was available during office visit.  No dyspnea or CHF symptoms.  Recommended repeat echo in 6 months with follow up.   Added to my schedule for surgical clearance.  Here with son.  Patient is very independent and active.  He does household chores, yard work and walking without chest pain or shortness of breath.  No orthopnea, PND, syncope, lower extremity edema or melena.  Dealing with right inguinal hernia pain for past 2 months.  Its been intermittent.  Now needs surgery.  Past Medical History:  Diagnosis Date   AMI (acute myocardial infarction) ([place]) 1980   Colon cancer ([place]) 81's   Detached retina    Hyperlipidemia    Hypertension     Past Surgical History:  Procedure Laterality Date   EYE SURGERY     PARTIAL COLECTOMY      Current Medications: Current Meds  Medication Sig   CALCIUM-MAGNESIUM-ZINC PO Take by mouth daily.   lisinopril (ZESTRIL) 5 MG tablet TAKE 1 TABLET(5 MG) BY MOUTH DAILY   Multiple Vitamins-Minerals (MENS MULTIVITAMIN PO) Take by mouth.   pravastatin (PRAVACHOL) 40 MG tablet Take 1 tablet (40 mg total) by mouth daily.   vitamin B-12 (CYANOCOBALAMIN) 1000 MCG tablet Take 1,000 mcg by mouth  daily.   VITAMIN D PO Take 125 mg by mouth daily.     Allergies:   Patient has no known allergies.   Social History   Socioeconomic History   Marital status: Married    Spouse name: Verdis Frederickson   Number of children: 2   Years of education: 6   Highest education level: 6th grade  Occupational History    Comment: Retired  Tobacco Use   Smoking status: Never   Smokeless tobacco: Never  Substance and Sexual Activity   Alcohol use: Yes    Comment: Occasional   Drug use: No   Sexual activity: Not on file  Other Topics Concern   Not on file  Social History Narrative   Lives with wife and son. He enjoys yardwork, watching soccer and news on TV.   Social Determinants of Health   Financial Resource Strain: Low Risk    Difficulty of Paying Living Expenses: Not hard at all  Food Insecurity: No Food Insecurity   Worried About Charity fundraiser in the Last Year: Never true   [place] in the Last Year: Never true  Transportation Needs: No Transportation Needs   Lack of Transportation (Medical): No   Lack of Transportation (Non-Medical): No  Physical Activity: Inactive   Days of Exercise per Week: 0 days   Minutes of Exercise per Session: 0 min  Stress: No Stress Concern Present   Feeling of Stress : Not at  all  Social Connections: Moderately Isolated   Frequency of Communication with Friends and Family: More than three times a week   Frequency of Social Gatherings with Friends and Family: More than three times a week   Attends Religious Services: Never   Marine scientist or Organizations: No   Attends Music therapist: Never   Marital Status: Married     Family History: The patient's family history includes Alzheimer's disease in an other family member; Cancer in his brother and father; Colon cancer in an other family member; Esophageal cancer in an other family member; Liver cancer in his father.    ROS:   Please see the history of present illness.     All other systems reviewed and are negative.   EKGs/Labs/Other Studies Reviewed:    The following studies were reviewed today:  Echo 08/2019 1. Prior LVEDD 52 mm, measured as 55 mm on current echo.. Left  ventricular ejection fraction, by estimation, is 50 to 55%. The left  ventricle has low normal function. The left ventricle has no regional wall  motion abnormalities. The left ventricular  internal cavity size was mildly dilated. Left ventricular diastolic  parameters are indeterminate.   2. Right ventricular systolic function is normal. The right ventricular  size is normal. There is normal pulmonary artery systolic pressure.   3. Left atrial size was mildly dilated.   4. The mitral valve is normal in structure. Mild mitral valve  regurgitation. No evidence of mitral stenosis.   5. Severe AI based on vena contracta, though moderate by PHT. Given  dilating chamber. The aortic valve is tricuspid. Aortic valve  regurgitation is moderate to severe. Mild aortic valve sclerosis is  present, with no evidence of aortic valve stenosis.   6. The inferior vena cava is normal in size with greater than 50%  respiratory variability, suggesting right atrial pressure of 3 mmHg.   Conclusion(s)/Recommendation(s): High suspicion for severe AI given vena  contracta and dilating LV EDD. Descending aortic flow reversal not well  seen.   EKG:  EKG is ordered today.  The ekg ordered today demonstrates normal sinus rhythm at rate of 63 bpm, PVC  Recent Labs: 04/26/2020: ALT 8; BUN 16; Creat 1.07; Hemoglobin 14.4; Platelets 236; Potassium 4.6; Sodium 135; TSH 0.93  Recent Lipid Panel    Component Value Date/Time   CHOL 193 04/26/2020 1044   TRIG 94 04/26/2020 1044   HDL 77 04/26/2020 1044   CHOLHDL 2.5 04/26/2020 1044   VLDL 21 11/03/2014 1145   LDLCALC 97 04/26/2020 1044    Physical Exam:    VS:  BP 128/60   Pulse 63   Ht '5\' 4"'$  (1.626 m)   Wt 123 lb 9.6 oz (56.1 kg)   SpO2 99%   BMI  21.22 kg/m     Wt Readings from Last 3 Encounters:  08/26/20 123 lb 9.6 oz (56.1 kg)  08/03/20 126 lb (57.2 kg)  04/26/20 130 lb (59 kg)     GEN: Well nourished, well developed in no acute distress HEENT: Normal NECK: No JVD; No carotid bruits LYMPHATICS: No lymphadenopathy CARDIAC: RRR, + murmurs, rubs, gallops RESPIRATORY:  Clear to auscultation without rales, wheezing or rhonchi  ABDOMEN: Soft, non-tender, non-distended MUSCULOSKELETAL:  No edema; No deformity  SKIN: Warm and dry NEUROLOGIC:  Alert and oriented x 3 PSYCHIATRIC:  Normal affect   ASSESSMENT AND PLAN:    Aortic insufficiency Patient denies dyspnea or CHF symptoms.  He is due  for echocardiogram.  Will update.  2.  Hypertension -Blood pressure control on lisinopril.  3.  Surgical clearance -Patient is active at baseline.  He is easily getting greater than 4 METS of activity without any issue.  Plan to update echocardiogram for aortic insufficiency.  Will review with Dr. Stanford Breed.  Addendum: "No need to wait on echo" per Dr. Stanford Breed   Given past medical history and time since last visit, based on ACC/AHA guidelines, Gregory Cuevas would be at acceptable risk for the planned procedure.  I will route this recommendation to the requesting party via Epic fax function and remove from pre-op pool.  Please call with questions.    4. HLD - Continue statin   Medication Adjustments/Labs and Tests Ordered: Current medicines are reviewed at length with the patient today.  Concerns regarding medicines are outlined above.  Orders Placed This Encounter  Procedures   EKG 12-Lead   No orders of the defined types were placed in this encounter.   Patient Instructions  Medication Instructions:  Your physician recommends that you continue on your current medications as directed. Please refer to the Current Medication list given to you today.  *If you need a refill on your cardiac medications before your next  appointment, please call your pharmacy*   Lab Work: None ordered  If you have labs (blood work) drawn today and your tests are completely normal, you will receive your results only by: [place] (if you have MyChart) OR A paper copy in the mail If you have any lab test that is abnormal or we need to change your treatment, we will call you to review the results.   Testing/Procedures: Your physician has requested that you have an echocardiogram. Echocardiography is a painless test that uses sound waves to create images of your heart. It provides your doctor with information about the size and shape of your heart and how well your heart's chambers and valves are working. This procedure takes approximately one hour. There are no restrictions for this procedure.    Follow-Up: At [facility], you and your health needs are our priority.  As part of our continuing mission to provide you with exceptional heart care, we have created designated Provider Care Teams.  These Care Teams include your primary Cardiologist (physician) and Advanced Practice Providers (APPs -  Physician Assistants and Nurse Practitioners) who all work together to provide you with the care you need, when you need it.  We recommend signing up for the patient portal called "MyChart".  Sign up information is provided on this After Visit Summary.  MyChart is used to connect with patients for Virtual Visits (Telemedicine).  Patients are able to view lab/test results, encounter notes, upcoming appointments, etc.  Non-urgent messages can be sent to your provider as well.   To learn more about what you can do with MyChart, go to [email or web address].    Your next appointment:   Based upon the testing  The format for your next appointment:   In Person  Provider:   Dr. Kirk Ruths   Other Instructions    Signed, Leanor Kail, PA  08/26/2020 2:51 PM    [place]

## 2020-08-27 NOTE — Telephone Encounter (Signed)
Patient has been seen.

## 2020-09-02 ENCOUNTER — Other Ambulatory Visit: Payer: Self-pay

## 2020-09-02 ENCOUNTER — Ambulatory Visit (HOSPITAL_COMMUNITY): Payer: Medicare Other | Attending: Cardiovascular Disease

## 2020-09-02 DIAGNOSIS — I351 Nonrheumatic aortic (valve) insufficiency: Secondary | ICD-10-CM | POA: Insufficient documentation

## 2020-09-02 LAB — ECHOCARDIOGRAM COMPLETE
Area-P 1/2: 1.78 cm2
MV VTI: 4.24 cm2
P 1/2 time: 328 msec
S' Lateral: 3.7 cm

## 2020-09-03 ENCOUNTER — Encounter: Payer: Self-pay | Admitting: *Deleted

## 2020-09-03 NOTE — Patient Instructions (Addendum)
DUE TO COVID-19 ONLY ONE VISITOR IS ALLOWED TO COME WITH YOU AND STAY IN THE WAITING ROOM ONLY DURING PRE OP AND PROCEDURE.   **NO VISITORS ARE ALLOWED IN THE SHORT STAY AREA OR RECOVERY ROOM!!**         Your procedure is scheduled on:  Thursday, 09-16-20   [facility] Main  Entrance   Report to Short Stay at 5:15 AM   [facility])    Call this number if you have problems the morning of surgery [phone]   Do not eat food :After Midnight.   May have liquids until 4:30 AM  day of surgery  CLEAR LIQUID DIET  Foods Allowed                                                                     Foods Excluded  Water, Black Coffee (no milk/no creamer) and tea, regular and decaf                  liquids that you cannot  Plain Jell-O in any flavor  (No red)                                     see through such as: Fruit ices (not with fruit pulp)                                      milk, soups, orange juice              Iced Popsicles (No red)                                      All solid food                                   Apple juices Sports drinks like Gatorade (No red) Lightly seasoned clear broth or consume(fat free) Sugar   Complete one Ensure drink the morning of surgery at  4:30 AM  the day of surgery.       The day of surgery:  Drink ONE (1) Pre-Surgery Clear Ensure the morning of surgery. Drink in one sitting. Do not sip.  This drink was given to you during your hospital  pre-op appointment visit. Nothing else to drink after completing the Pre-Surgery Clear Ensure.          If you have questions, please contact your surgeon's office.     Oral Hygiene is also important to reduce your risk of infection.                                    Remember - BRUSH YOUR TEETH THE MORNING OF SURGERY WITH YOUR REGULAR TOOTHPASTE   Do NOT smoke after Midnight   Take these medicines the morning of surgery with A SIP OF WATER:  Pravastatin  You may not have any metal on your body including  jewelry, and body piercing             Do not wear  lotions, powders, cologne, or deodorant              Men may shave face and neck.   Do not bring valuables to the hospital. [place].   Contacts, dentures or bridgework may not be worn into surgery.    Patients discharged the day of surgery will not be allowed to drive home.   Please read over the following fact sheets you were given: IF YOU HAVE QUESTIONS ABOUT YOUR PRE OP INSTRUCTIONS PLEASE CALL [place] - Preparing for Surgery Before surgery, you can play an important role.  Because skin is not sterile, your skin needs to be as free of germs as possible.  You can reduce the number of germs on your skin by washing with CHG (chlorahexidine gluconate) soap before surgery.  CHG is an antiseptic cleaner which kills germs and bonds with the skin to continue killing germs even after washing. Please DO NOT use if you have an allergy to CHG or antibacterial soaps.  If your skin becomes reddened/irritated stop using the CHG and inform your nurse when you arrive at Short Stay. Do not shave (including legs and underarms) for at least 48 hours prior to the first CHG shower.  You may shave your face/neck.  Please follow these instructions carefully:  1.  Shower with CHG Soap the night before surgery and the  morning of surgery.  2.  If you choose to wash your hair, wash your hair first as usual with your normal  shampoo.  3.  After you shampoo, rinse your hair and body thoroughly to remove the shampoo.                             4.  Use CHG as you would any other liquid soap.  You can apply chg directly to the skin and wash.  Gently with a scrungie or clean washcloth.  5.  Apply the CHG Soap to your body ONLY FROM THE NECK DOWN.   Do   not use on face/ open                           Wound or open sores. Avoid contact with  eyes, ears mouth and   genitals (private parts).                       Wash face,  Genitals (private parts) with your normal soap.             6.  Wash thoroughly, paying special attention to the area where your    surgery  will be performed.  7.  Thoroughly rinse your body with warm water from the neck down.  8.  DO NOT shower/wash with your normal soap after using and rinsing off the CHG Soap.                9.  Pat yourself dry with a clean towel.            10.  Wear clean pajamas.            11.  Place clean sheets on your  bed the night of your first shower and do not  sleep with pets. Day of Surgery : Do not apply any lotions/deodorants the morning of surgery.  Please wear clean clothes to the hospital/surgery center.  FAILURE TO FOLLOW THESE INSTRUCTIONS MAY RESULT IN THE CANCELLATION OF YOUR SURGERY  PATIENT SIGNATURE_________________________________  NURSE SIGNATURE__________________________________  ________________________________________________________________________

## 2020-09-03 NOTE — Progress Notes (Addendum)
COVID swab appointment: N/A  COVID Vaccine Completed: Yes x3 Date COVID Vaccine completed: 03-28-19 Has received booster: 01-01-20, 07-01-20 COVID vaccine manufacturer: Camilla  Date of COVID positive in last 90 days: No  PCP - [name], PA-C Cardiologist - Kirk Ruths, MD  Cardiac clearance in office note in Epic dated 08-26-20 by B. Curly [place], PA  Chest x-ray - N/A EKG - 08-26-20 Epic Stress Test - greater than 2 years ECHO - 09-02-20 Epic Cardiac Cath -  N/A Pacemaker/ICD device last checked: Spinal Cord Stimulator:  Sleep Study - N/A CPAP -   Fasting Blood Sugar - N/A Checks Blood Sugar _____ times a day  Blood Thinner Instructions: N/A Aspirin Instructions: Last Dose:  Activity level:  Can go up a flight of stairs and perform activities of daily living without stopping and without symptoms of chest pain or shortness of breath.       Anesthesia review:  Cardiomegaly, aortic regurg, HTN.  Hx of MI  Patient denies shortness of breath, fever, cough and chest pain at PAT appointment   Patient verbalized understanding of instructions that were given to them at the PAT appointment. Patient was also instructed that they will need to review over the PAT instructions again at home before surgery.

## 2020-09-06 ENCOUNTER — Ambulatory Visit: Payer: Self-pay | Admitting: General Surgery

## 2020-09-08 ENCOUNTER — Other Ambulatory Visit: Payer: Self-pay

## 2020-09-08 ENCOUNTER — Encounter (HOSPITAL_COMMUNITY)
Admission: RE | Admit: 2020-09-08 | Discharge: 2020-09-08 | Disposition: A | Discharge status: Home / Self Care | Payer: Medicare Other | Source: Ambulatory Visit | Attending: General Surgery | Admitting: General Surgery

## 2020-09-08 ENCOUNTER — Encounter (HOSPITAL_COMMUNITY): Payer: Self-pay

## 2020-09-08 DIAGNOSIS — Z01812 Encounter for preprocedural laboratory examination: Secondary | ICD-10-CM | POA: Insufficient documentation

## 2020-09-08 HISTORY — DX: Cardiomegaly: I51.7

## 2020-09-08 HISTORY — DX: Nonrheumatic aortic (valve) insufficiency: I35.1

## 2020-09-08 LAB — BASIC METABOLIC PANEL
Anion gap: 6 (ref 5–15)
BUN: 22 mg/dL (ref 8–23)
CO2: 31 mmol/L (ref 22–32)
Calcium: 9.6 mg/dL (ref 8.9–10.3)
Chloride: 104 mmol/L (ref 98–111)
Creatinine, Ser: 0.98 mg/dL (ref 0.61–1.24)
GFR, Estimated: >60 mL/min (ref >60)
Glucose, Bld: 105 mg/dL — ABNORMAL HIGH (ref 70–99)
Potassium: 4.4 mmol/L (ref 3.5–5.1)
Sodium: 141 mmol/L (ref 135–145)

## 2020-09-08 LAB — CBC WITH DIFFERENTIAL/PLATELET
Abs Immature Granulocytes: 0.01 10*3/uL (ref 0.00–0.07)
Basophils Absolute: 0 10*3/uL (ref 0.0–0.1)
Basophils Relative: 1 %
Eosinophils Absolute: 0.2 10*3/uL (ref 0.0–0.5)
Eosinophils Relative: 3 %
HCT: 42.7 % (ref 39.0–52.0)
Hemoglobin: 14 g/dL (ref 13.0–17.0)
Immature Granulocytes: 0 %
Lymphocytes Relative: 30 %
Lymphs Abs: 1.4 10*3/uL (ref 0.7–4.0)
MCH: 30 pg (ref 26.0–34.0)
MCHC: 32.8 g/dL (ref 30.0–36.0)
MCV: 91.4 fL (ref 80.0–100.0)
Monocytes Absolute: 0.4 10*3/uL (ref 0.1–1.0)
Monocytes Relative: 8 %
Neutro Abs: 2.7 10*3/uL (ref 1.7–7.7)
Neutrophils Relative %: 58 %
Platelets: 202 10*3/uL (ref 150–400)
RBC: 4.67 MIL/uL (ref 4.22–5.81)
RDW: 12.6 % (ref 11.5–15.5)
WBC: 4.8 10*3/uL (ref 4.0–10.5)
nRBC: 0 % (ref 0.0–0.2)

## 2020-09-09 NOTE — Progress Notes (Signed)
Anesthesia Chart Review   Case: [id number] Date/Time: 09/16/20 0715   Procedure: OPEN REPAIR RECURRENT RIGHT INGUINAL HERNIA WITH MESH (Right) - TAP BLOCK   Anesthesia type: General   Pre-op diagnosis: RECURRENT RIGHT INGUINAL HERNIA   Location: [facility] ROOM 01 / [facility]   Surgeons: [name], MD       DISCUSSION:87 y.o. never smoker with h/o HTN, CAD, moderate to severe aortic valve regurgitation, recurrent right inguinal hernia scheduled for above procedure 09/16/20 with Dr. [name].   Pt last seen by cardiology 08/26/20. Per OV note, "-Patient is active at baseline.  He is easily getting greater than 4 METS of activity without any issue.  Plan to update echocardiogram for aortic insufficiency.  Will review with Dr. Stanford Breed.   Addendum: "No need to wait on echo" per Dr. Stanford Breed"  Echo was completed 09/02/2020 with EF 55-60%, moderate to severe aortic regurgitation, no aortic stenosis. Unchanged from prior Echo.  VS: BP (!) 122/56   Pulse 62   Temp 36.9 C (Oral)   Resp 16   Ht '5\' 4"'$  (1.626 m)   Wt 55.6 kg   SpO2 100%   BMI 21.04 kg/m   PROVIDERS: Donella Stade, PA-C is PCP   Kirk Ruths, MD is Cardiologist  LABS: Labs reviewed: Acceptable for surgery. (all labs ordered are listed, but only abnormal results are displayed)  Labs Reviewed  BASIC METABOLIC PANEL - Abnormal; Notable for the following components:      Result Value   Glucose, Bld 105 (*)    All other components within normal limits  CBC WITH DIFFERENTIAL/PLATELET     IMAGES:   EKG: 08/26/20 Rate 63 bpm   CV: Echo 09/02/2020  1. Moderate to severe (likely moderate) aortic regurgitation is present.  PHT 328 msec. LVIDD is normal (53 mm) and unchanged. LVIDS is normal (37  mm). There is no diastolic flow reveral in the descending aorta. The jet  is central and likely related to  degeneration. Unchanged from prior and likely closer to moderate. The  aortic valve is tricuspid. Aortic valve  regurgitation is moderate to  severe. No aortic stenosis is present.   2. Left ventricular ejection fraction, by estimation, is 55 to 60%. Left  ventricular ejection fraction by 3D volume is 57 %. The left ventricle has  normal function. The left ventricle has no regional wall motion  abnormalities. Left ventricular diastolic   function could not be evaluated. The average left ventricular global  longitudinal strain is -24.5 %. The global longitudinal strain is normal.   3. Right ventricular systolic function is normal. The right ventricular  size is normal. There is normal pulmonary artery systolic pressure. The  estimated right ventricular systolic pressure is [id number] mmHg.   4. The mitral valve is grossly normal. Trivial mitral valve  regurgitation. No evidence of mitral stenosis.   5. The inferior vena cava is normal in size with greater than 50%  respiratory variability, suggesting right atrial pressure of 3 mmHg.   6. There is a small patent foramen ovale with predominantly left to right  shunting across the atrial septum.  Past Medical History:  Diagnosis Date   AMI (acute myocardial infarction) ([place]) 1980   Aortic regurgitation    Cardiomegaly    Colon cancer ([place]) 1990's   Detached retina    Hyperlipidemia    Hypertension     Past Surgical History:  Procedure Laterality Date   EYE SURGERY     PARTIAL COLECTOMY  MEDICATIONS:  lisinopril (ZESTRIL) 5 MG tablet   Multiple Vitamins-Minerals (MENS MULTIVITAMIN PO)   pravastatin (PRAVACHOL) 40 MG tablet   vitamin B-12 (CYANOCOBALAMIN) 1000 MCG tablet   VITAMIN D PO   No current facility-administered medications for this encounter.    Gregory Felix Ward, PA-C [facility] Pre-Surgical Testing [phone]

## 2020-09-09 NOTE — Anesthesia Preprocedure Evaluation (Addendum)
Anesthesia Evaluation  Patient identified by MRN, date of birth, ID band Patient awake    Reviewed: Allergy & Precautions, NPO status , Patient's Chart, lab work & pertinent test results  History of Anesthesia Complications Negative for: history of anesthetic complications  Airway Mallampati: II  TM Distance: >3 FB Neck ROM: Full    Dental no notable dental hx. (+) Dental Advisory Given, Missing   Pulmonary neg pulmonary ROS,    Pulmonary exam normal        Cardiovascular hypertension, Pt. on medications Normal cardiovascular exam+ Valvular Problems/Murmurs AI   Pt last seen by cardiology 08/26/20. Per OV note, "-Patient is active at baseline. He is easily getting greater than 4 METS of activity without any issue. Plan to update echocardiogram for aortic insufficiency. Will review with Dr. Stanford Breed.  Addendum: "No need to wait on echo" per Dr. Stanford Breed"  Echo was completed 09/02/2020 with EF 55-60%, moderate to severe aortic regurgitation, no aortic stenosis. Unchanged from prior Echo.    Neuro/Psych PSYCHIATRIC DISORDERS Anxiety Depression negative neurological ROS     GI/Hepatic negative GI ROS, Neg liver ROS,   Endo/Other  negative endocrine ROS  Renal/GU negative Renal ROS     Musculoskeletal negative musculoskeletal ROS (+)   Abdominal   Peds  Hematology negative hematology ROS (+)   Anesthesia Other Findings   Reproductive/Obstetrics                           Anesthesia Physical Anesthesia Plan  ASA: 3  Anesthesia Plan: General   Post-op Pain Management:  Regional for Post-op pain   Induction: Intravenous  PONV Risk Score and Plan: 2 and Ondansetron, Dexamethasone and Treatment may vary due to age or medical condition  Airway Management Planned: LMA and Oral ETT  Additional Equipment:   Intra-op Plan:   Post-operative Plan: Extubation in OR  Informed Consent: I  have reviewed the patients History and Physical, chart, labs and discussed the procedure including the risks, benefits and alternatives for the proposed anesthesia with the patient or authorized representative who has indicated his/her understanding and acceptance.     Dental advisory given  Plan Discussed with: Anesthesiologist and CRNA  Anesthesia Plan Comments: (See PAT note 09/08/2020, Konrad Felix Ward, PA-C)      Anesthesia Quick Evaluation

## 2020-09-15 ENCOUNTER — Other Ambulatory Visit (HOSPITAL_COMMUNITY): Payer: Medicare Other

## 2020-09-15 ENCOUNTER — Encounter (HOSPITAL_COMMUNITY): Payer: Self-pay | Admitting: General Surgery

## 2020-09-15 NOTE — H&P (Signed)
REFERRING PHYSICIAN: Ottis Stain*  PROVIDER: Zarya Lasseigne Leanne Chang, MD  MRN: [id number] DOB: 1933-12-07 DATE OF ENCOUNTER: 08/25/2020  Subjective   Chief Complaint: Hernia   History of Present Illness: Gregory Cuevas is a 87 y.o. male who is seen today as an office consultation at the request of Dr. Maisie Fus for evaluation of Hernia .   He is accompanied by his son who assists in interpretation. Although the patient's English is fairly good. He states that he had a right inguinal hernia repair over 40 years ago. The past few months he has noticed a bulge in his right groin. It definitely bothers him on a daily basis. In the morning it is soft and really not out. However throughout the day the hernia gets larger and it interferes with his daily activities such as yard work. He describes as a burning sensation. He denies any chest pain or chest pressure. He may get some shortness of breath with activity but no shortness of breath at rest. He does have moderate to severe aortic insufficiency and is followed by Dr. Stanford Breed. I reviewed his last office note from fall 2021. He wanted a repeat echocardiogram in 6 months. To my knowledge that has not been done. He denies any tobacco use. He denies any other abdominal surgery. No TIAs or amaurosis fugax. He does have glaucoma.  Review of Systems: A complete review of systems was obtained from the patient. I have reviewed this information and discussed as appropriate with the patient. See HPI as well for other ROS.  ROS   Medical History: Past Medical History:  Diagnosis Date   Aortic insufficiency   Glaucoma (increased eye pressure)   History of cancer   Hypertension   There is no problem list on file for this patient.  Past Surgical History:  Procedure Laterality Date   INGUINAL HERNIA REPAIR  40 yrs ago    Not on File  Current Outpatient Medications on File Prior to Visit  Medication Sig Dispense Refill   cyanocobalamin (VITAMIN  B12) 1000 MCG tablet Take by mouth   lisinopriL (ZESTRIL) 5 MG tablet   pravastatin (PRAVACHOL) 40 MG tablet   No current facility-administered medications on file prior to visit.   Family History  Problem Relation Age of Onset   Colon cancer Brother    Social History   Tobacco Use  Smoking Status Never Smoker  Smokeless Tobacco Never Used    Social History   Socioeconomic History   Marital status: Married  Tobacco Use   Smoking status: Never Smoker   Smokeless tobacco: Never Used  Scientific laboratory technician Use: Never used  Substance and Sexual Activity   Alcohol use: Never   Drug use: Defer   Sexual activity: Defer   Objective:   Vitals:  08/25/20 1156  BP: 120/62  Pulse: 65  Temp: 36.4 C (97.6 F)  SpO2: 100%  Weight: 56.4 kg (124 lb 6.4 oz)   There is no height or weight on file to calculate BMI.  Constitutional: NAD; conversant; no deformities Eyes: Moist conjunctiva; no lid lag; anicteric; PERRL Neck: Trachea midline; no thyromegaly Lungs: Normal respiratory effort; no tactile fremitus CV: RRR; no palpable thrills; no pitting edema GI: Abd soft, nontender, nondistended; old suprapubic transverse Pfannenstiel incision; no palpable hepatosplenomegaly GU: Obvious bulge in right groin. Reducible. No bulge in left groin with Valsalva. Both testicles descended. MSK: Normal gait; no clubbing/cyanosis Psychiatric: Appropriate affect; alert and oriented x3 Lymphatic: No palpable cervical or axillary lymphadenopathy  Skin: No rash, jaundice or lesions  Labs, Imaging and Diagnostic Testing: I reviewed his cardiology last clinic note as well as his PCPs last office notes  Assessment and Plan:  Diagnoses and all orders for this visit:  Unilateral recurrent inguinal hernia without obstruction or gangrene  Aortic valve insufficiency, etiology of cardiac valve disease unspecified    He states that he had a prior right inguinal hernia repair. The method of the  incision is a little bit atypical. He denies any prostate surgery or other intestinal surgery. He has a Pfannenstiel incision. Because of his AI and advanced age I recommended an open approach as opposed to a minimally invasive approach as well as the fact that is unclear what the Pfannenstiel incision was actually for. I think we may actually have less scar tissue going through a classic inguinal incision  He is symptomatic and desires surgical repair. I think that is reasonable even with his advanced age.  I told him and his son that we would need to get an updated cardiology evaluation to gauge his risk. If found to be high risk then we would need to revisit the operative plan  We discussed the etiology of inguinal hernias. We discussed the signs & symptoms of incarceration & strangulation. We discussed non-operative and operative management. We discussed open The patient has elected open repair of right recurrent inguinal hernia with mesh  I described the procedure in detail. The patient was given educational material in [place]. We discussed the risks and benefits including but not limited to bleeding, infection, chronic inguinal pain, nerve entrapment, hernia recurrence, mesh complications, hematoma formation, urinary retention, injury to the testicles, numbness in the groin, blood clots, injury to the surrounding structures, and anesthesia risk. We also discussed the typical post operative recovery course, including no heavy lifting for 4-6 weeks. I explained that the likelihood of improvement of their symptoms is good  There is a moderate complex medical problem since he has exacerbation of a chronic inguinal hernia requiring a moderate level of data review with a moderate level of discussion regarding risk and benefits of surgery  No follow-ups on file.  Iliza Blankenbeckler Leanne Chang, MD  General, Minimally Invasive, & Bariatric Surgery

## 2020-09-16 ENCOUNTER — Ambulatory Visit (HOSPITAL_COMMUNITY): Payer: Medicare Other | Admitting: Anesthesiology

## 2020-09-16 ENCOUNTER — Ambulatory Visit (HOSPITAL_COMMUNITY): Payer: Medicare Other | Admitting: Physician Assistant

## 2020-09-16 ENCOUNTER — Encounter (HOSPITAL_COMMUNITY): Payer: Self-pay | Admitting: General Surgery

## 2020-09-16 ENCOUNTER — Encounter (HOSPITAL_COMMUNITY)
Admission: RE | Disposition: A | Discharge status: Home / Self Care | Payer: Self-pay | Source: Ambulatory Visit | Attending: General Surgery

## 2020-09-16 ENCOUNTER — Ambulatory Visit (HOSPITAL_COMMUNITY)
Admission: RE | Admit: 2020-09-16 | Discharge: 2020-09-16 | Disposition: A | Discharge status: Home / Self Care | Payer: Medicare Other | Source: Ambulatory Visit | Attending: General Surgery | Admitting: General Surgery

## 2020-09-16 DIAGNOSIS — K4091 Unilateral inguinal hernia, without obstruction or gangrene, recurrent: Secondary | ICD-10-CM | POA: Insufficient documentation

## 2020-09-16 DIAGNOSIS — H409 Unspecified glaucoma: Secondary | ICD-10-CM | POA: Diagnosis not present

## 2020-09-16 DIAGNOSIS — Z79899 Other long term (current) drug therapy: Secondary | ICD-10-CM | POA: Insufficient documentation

## 2020-09-16 DIAGNOSIS — K4090 Unilateral inguinal hernia, without obstruction or gangrene, not specified as recurrent: Secondary | ICD-10-CM | POA: Diagnosis not present

## 2020-09-16 DIAGNOSIS — Z800 Family history of malignant neoplasm of digestive organs: Secondary | ICD-10-CM | POA: Diagnosis not present

## 2020-09-16 DIAGNOSIS — E785 Hyperlipidemia, unspecified: Secondary | ICD-10-CM | POA: Diagnosis not present

## 2020-09-16 DIAGNOSIS — G8918 Other acute postprocedural pain: Secondary | ICD-10-CM | POA: Diagnosis not present

## 2020-09-16 DIAGNOSIS — I10 Essential (primary) hypertension: Secondary | ICD-10-CM | POA: Insufficient documentation

## 2020-09-16 DIAGNOSIS — I351 Nonrheumatic aortic (valve) insufficiency: Secondary | ICD-10-CM | POA: Insufficient documentation

## 2020-09-16 DIAGNOSIS — I872 Venous insufficiency (chronic) (peripheral): Secondary | ICD-10-CM | POA: Diagnosis not present

## 2020-09-16 HISTORY — PX: INGUINAL HERNIA REPAIR: SHX194

## 2020-09-16 SURGERY — REPAIR, HERNIA, INGUINAL, ADULT
Anesthesia: General | Site: Abdomen | Laterality: Right

## 2020-09-16 MED ORDER — BUPIVACAINE-EPINEPHRINE 0.25% -1:200000 IJ SOLN
INTRAMUSCULAR | Status: DC | PRN
  Administered 2020-09-16: 5 mL

## 2020-09-16 MED ORDER — AMISULPRIDE (ANTIEMETIC) 5 MG/2ML IV SOLN: 10.0000 mg | Freq: Once | INTRAVENOUS | Status: DC | PRN

## 2020-09-16 MED ORDER — BUPIVACAINE HCL (PF) 0.25 % IJ SOLN
INTRAMUSCULAR | Status: DC | PRN
  Administered 2020-09-16: 15 mL

## 2020-09-16 MED ORDER — PROMETHAZINE HCL 25 MG/ML IJ SOLN: 6.2500 mg | INTRAMUSCULAR | Status: DC | PRN

## 2020-09-16 MED ORDER — FENTANYL CITRATE PF 50 MCG/ML IJ SOSY
PREFILLED_SYRINGE | INTRAMUSCULAR | Status: AC
  Filled 2020-09-16: qty 1

## 2020-09-16 MED ORDER — BUPIVACAINE LIPOSOME 1.3 % IJ SUSP
INTRAMUSCULAR | Status: DC | PRN
  Administered 2020-09-16: 10 mL

## 2020-09-16 MED ORDER — GLYCOPYRROLATE 0.2 MG/ML IJ SOLN
INTRAMUSCULAR | Status: AC
  Filled 2020-09-16: qty 1

## 2020-09-16 MED ORDER — DEXAMETHASONE SODIUM PHOSPHATE 4 MG/ML IJ SOLN
INTRAMUSCULAR | Status: DC | PRN
  Administered 2020-09-16: 6 mg via INTRAVENOUS

## 2020-09-16 MED ORDER — CHLORHEXIDINE GLUCONATE CLOTH 2 % EX PADS: 6.0000 | MEDICATED_PAD | Freq: Once | CUTANEOUS | Status: DC

## 2020-09-16 MED ORDER — PROPOFOL 10 MG/ML IV BOLUS
INTRAVENOUS | Status: AC
  Filled 2020-09-16: qty 20

## 2020-09-16 MED ORDER — FENTANYL CITRATE (PF) 100 MCG/2ML IJ SOLN
INTRAMUSCULAR | Status: AC
  Filled 2020-09-16: qty 2

## 2020-09-16 MED ORDER — LACTATED RINGERS IV SOLN: INTRAVENOUS | Status: DC

## 2020-09-16 MED ORDER — ORAL CARE MOUTH RINSE: 15.0000 mL | Freq: Once | OROMUCOSAL | Status: AC

## 2020-09-16 MED ORDER — ACETAMINOPHEN 500 MG PO TABS: 1000.0000 mg | ORAL_TABLET | Freq: Three times a day (TID) | ORAL | 0 refills | Status: AC

## 2020-09-16 MED ORDER — ONDANSETRON HCL 4 MG/2ML IJ SOLN
INTRAMUSCULAR | Status: AC
  Filled 2020-09-16: qty 4

## 2020-09-16 MED ORDER — TAMSULOSIN HCL 0.4 MG PO CAPS
0.4000 mg | ORAL_CAPSULE | Freq: Every day | ORAL | 0 refills | Status: DC
Start: 2020-09-16 — End: 2020-11-02

## 2020-09-16 MED ORDER — ENSURE PRE-SURGERY PO LIQD: 296.0000 mL | Freq: Once | ORAL | Status: DC

## 2020-09-16 MED ORDER — CHLORHEXIDINE GLUCONATE 0.12 % MT SOLN
15.0000 mL | Freq: Once | OROMUCOSAL | Status: AC
  Administered 2020-09-16: 15 mL via OROMUCOSAL

## 2020-09-16 MED ORDER — ESMOLOL HCL 100 MG/10ML IV SOLN
INTRAVENOUS | Status: AC
  Filled 2020-09-16: qty 10

## 2020-09-16 MED ORDER — 0.9 % SODIUM CHLORIDE (POUR BTL) OPTIME
TOPICAL | Status: DC | PRN
  Administered 2020-09-16: 1000 mL

## 2020-09-16 MED ORDER — OXYCODONE HCL 5 MG PO TABS: 5.0000 mg | ORAL_TABLET | Freq: Four times a day (QID) | ORAL | 0 refills | Status: DC | PRN

## 2020-09-16 MED ORDER — ROCURONIUM BROMIDE 10 MG/ML (PF) SYRINGE
PREFILLED_SYRINGE | INTRAVENOUS | Status: AC
  Filled 2020-09-16: qty 10

## 2020-09-16 MED ORDER — EPHEDRINE SULFATE 50 MG/ML IJ SOLN
INTRAMUSCULAR | Status: DC | PRN
  Administered 2020-09-16: 5 mg via INTRAVENOUS

## 2020-09-16 MED ORDER — ACETAMINOPHEN 500 MG PO TABS
1000.0000 mg | ORAL_TABLET | ORAL | Status: AC
  Administered 2020-09-16: 1000 mg via ORAL
  Filled 2020-09-16: qty 2

## 2020-09-16 MED ORDER — LIDOCAINE 2% (20 MG/ML) 5 ML SYRINGE
INTRAMUSCULAR | Status: AC
  Filled 2020-09-16: qty 10

## 2020-09-16 MED ORDER — PROPOFOL 10 MG/ML IV BOLUS
INTRAVENOUS | Status: AC
  Filled 2020-09-16: qty 40

## 2020-09-16 MED ORDER — PHENYLEPHRINE HCL (PRESSORS) 10 MG/ML IV SOLN
INTRAVENOUS | Status: AC
  Filled 2020-09-16: qty 2

## 2020-09-16 MED ORDER — FENTANYL CITRATE PF 50 MCG/ML IJ SOSY
PREFILLED_SYRINGE | INTRAMUSCULAR | Status: AC
  Administered 2020-09-16: 50 ug via INTRAVENOUS
  Filled 2020-09-16: qty 1

## 2020-09-16 MED ORDER — FENTANYL CITRATE PF 50 MCG/ML IJ SOSY
25.0000 ug | PREFILLED_SYRINGE | INTRAMUSCULAR | Status: DC | PRN
  Administered 2020-09-16: 50 ug via INTRAVENOUS

## 2020-09-16 MED ORDER — CEFAZOLIN SODIUM-DEXTROSE 2-4 GM/100ML-% IV SOLN
2.0000 g | INTRAVENOUS | Status: AC
  Administered 2020-09-16: 2 g via INTRAVENOUS
  Filled 2020-09-16: qty 100

## 2020-09-16 MED ORDER — ACETAMINOPHEN 500 MG PO TABS: 1000.0000 mg | ORAL_TABLET | Freq: Once | ORAL | Status: DC

## 2020-09-16 MED ORDER — FENTANYL CITRATE (PF) 100 MCG/2ML IJ SOLN
INTRAMUSCULAR | Status: DC | PRN
  Administered 2020-09-16: 50 ug via INTRAVENOUS
  Administered 2020-09-16 (×3): 25 ug via INTRAVENOUS
  Administered 2020-09-16: 50 ug via INTRAVENOUS
  Administered 2020-09-16: 25 ug via INTRAVENOUS

## 2020-09-16 MED ORDER — BUPIVACAINE-EPINEPHRINE (PF) 0.25% -1:200000 IJ SOLN
INTRAMUSCULAR | Status: AC
  Filled 2020-09-16: qty 30

## 2020-09-16 MED ORDER — PROPOFOL 10 MG/ML IV BOLUS
INTRAVENOUS | Status: DC | PRN
  Administered 2020-09-16: 150 mg via INTRAVENOUS

## 2020-09-16 MED ORDER — ONDANSETRON HCL 4 MG/2ML IJ SOLN
INTRAMUSCULAR | Status: DC | PRN
  Administered 2020-09-16: 4 mg via INTRAVENOUS

## 2020-09-16 MED ORDER — DEXAMETHASONE SODIUM PHOSPHATE 10 MG/ML IJ SOLN
INTRAMUSCULAR | Status: AC
  Filled 2020-09-16: qty 2

## 2020-09-16 MED ORDER — CELECOXIB 200 MG PO CAPS
200.0000 mg | ORAL_CAPSULE | Freq: Once | ORAL | Status: AC
  Administered 2020-09-16: 200 mg via ORAL
  Filled 2020-09-16: qty 1

## 2020-09-16 MED ORDER — GLYCOPYRROLATE 0.2 MG/ML IJ SOLN
INTRAMUSCULAR | Status: DC | PRN
  Administered 2020-09-16: .2 mg via INTRAVENOUS

## 2020-09-16 SURGICAL SUPPLY — 31 items
ADH SKN CLS APL DERMABOND .7 (GAUZE/BANDAGES/DRESSINGS) ×1
APL SKNCLS STERI-STRIP NONHPOA (GAUZE/BANDAGES/DRESSINGS)
BAG COUNTER SPONGE SURGICOUNT (BAG) IMPLANT
BAG SPNG CNTER NS LX DISP (BAG)
BENZOIN TINCTURE PRP APPL 2/3 (GAUZE/BANDAGES/DRESSINGS) IMPLANT
COVER SURGICAL LIGHT HANDLE (MISCELLANEOUS) ×2 IMPLANT
DECANTER SPIKE VIAL GLASS SM (MISCELLANEOUS) IMPLANT
DERMABOND ADVANCED (GAUZE/BANDAGES/DRESSINGS) ×1
DERMABOND ADVANCED .7 DNX12 (GAUZE/BANDAGES/DRESSINGS) IMPLANT
DRAIN PENROSE 0.5X18 (DRAIN) IMPLANT
DRAPE LAPAROTOMY T 98X78 PEDS (DRAPES) ×2 IMPLANT
DRSG TEGADERM 4X4.75 (GAUZE/BANDAGES/DRESSINGS) IMPLANT
ELECT REM PT RETURN 15FT ADLT (MISCELLANEOUS) ×2 IMPLANT
GLOVE SRG 8 PF TXTR STRL LF DI (GLOVE) ×1 IMPLANT
GLOVE SURG POLY ORTHO LF SZ7.5 (GLOVE) ×2 IMPLANT
GLOVE SURG UNDER POLY LF SZ8 (GLOVE) ×2
GOWN STRL REUS W/TWL XL LVL3 (GOWN DISPOSABLE) ×4 IMPLANT
KIT BASIN OR (CUSTOM PROCEDURE TRAY) ×2 IMPLANT
KIT TURNOVER KIT A (KITS) ×2 IMPLANT
MESH ULTRAPRO 3X6 7.6X15CM (Mesh General) ×1 IMPLANT
NEEDLE HYPO 22GX1.5 SAFETY (NEEDLE) ×2 IMPLANT
PACK GENERAL/GYN (CUSTOM PROCEDURE TRAY) ×2 IMPLANT
STRIP CLOSURE SKIN 1/2X4 (GAUZE/BANDAGES/DRESSINGS) ×2 IMPLANT
SUT MNCRL AB 4-0 PS2 18 (SUTURE) ×2 IMPLANT
SUT PROLENE 2 0 CT2 30 (SUTURE) ×4 IMPLANT
SUT VIC AB 2-0 SH 27 (SUTURE) ×2
SUT VIC AB 2-0 SH 27X BRD (SUTURE) ×1 IMPLANT
SUT VIC AB 3-0 SH 18 (SUTURE) ×2 IMPLANT
SYR 20ML LL LF (SYRINGE) ×2 IMPLANT
TOWEL OR 17X26 10 PK STRL BLUE (TOWEL DISPOSABLE) ×2 IMPLANT
TOWEL OR NON WOVEN STRL DISP B (DISPOSABLE) ×2 IMPLANT

## 2020-09-16 NOTE — Discharge Instructions (Addendum)
[facility]  UMBILICAL OR INGUINAL HERNIA REPAIR: POST OP INSTRUCTIONS  Always review your discharge instruction sheet given to you by the facility where your surgery was performed. IF YOU HAVE DISABILITY OR FAMILY LEAVE FORMS, YOU MUST BRING THEM TO THE OFFICE FOR PROCESSING.   DO NOT GIVE THEM TO YOUR DOCTOR.  A  prescription for pain medication may be given to you upon discharge.  Take your pain medication as prescribed, if needed.  If narcotic pain medicine is not needed, then you may take acetaminophen (Tylenol) or ibuprofen (Advil) as needed. Take your usually prescribed medications unless otherwise directed. If you need a refill on your pain medication, please contact your pharmacy.  They will contact our office to request authorization. Prescriptions will not be filled after 5 pm or on week-ends. You should follow a light diet the first 24 hours after arrival home, such as soup and crackers, etc.  Be sure to include lots of fluids daily.  Resume your normal diet the day after surgery. Most patients will experience some swelling and bruising around the umbilicus or in the groin and scrotum.  Ice packs and reclining will help.  Swelling and bruising can take several days to resolve.  It is common to experience some constipation if taking pain medication after surgery.  Increasing fluid intake and taking a stool softener (such as Colace) will usually help or prevent this problem from occurring.  A mild laxative (Milk of Magnesia or Miralax) should be taken according to package directions if there are no bowel movements after 48 hours. Unless discharge instructions indicate otherwise, you may remove your bandages 24-48 hours after surgery, and you may shower at that time.  You may have steri-strips (small skin tapes) in place directly over the incision.  These strips should be left on the skin for 7-10 days.  If your surgeon used skin glue on the incision, you may shower in 24  hours.  The glue will flake off over the next 2-3 weeks.  Any sutures or staples will be removed at the office during your follow-up visit. ACTIVITIES:  You may resume regular (light) daily activities beginning the next day--such as daily self-care, walking, climbing stairs--gradually increasing activities as tolerated.  You may have sexual intercourse when it is comfortable.  Refrain from any heavy lifting or straining until approved by your doctor. You may drive when you are no longer taking prescription pain medication, you can comfortably wear a seatbelt, and you can safely maneuver your car and apply brakes. RETURN TO WORK:  You should see your doctor in the office for a follow-up appointment approximately 2-3 weeks after your surgery.  Make sure that you call for this appointment within a day or two after you arrive home to insure a convenient appointment time. OTHER INSTRUCTIONS:     WHEN TO CALL YOUR DOCTOR: Fever over 101.0 Inability to urinate Nausea and/or vomiting Extreme swelling or bruising Continued bleeding from incision. Increased pain, redness, or drainage from the incision  The clinic staff is available to answer your questions during regular business hours.  Please don't hesitate to call and ask to speak to one of the nurses for clinical concerns.  If you have a medical emergency, go to the nearest emergency room or call 911.  A surgeon from [facility] is always on call at the hospital   [address], [address], [place], [place]  [address] ?  [address] [address], , [place]     [id number] ? [phone] ? FAX [phone] Web site: [email or web address]     Managing Your Pain After Surgery Without Opioids    Thank you for participating in our program to help patients manage their pain after surgery without opioids. This is part of our effort to provide you with the best care possible, without exposing you or your family to the  risk that opioids pose.  What pain can I expect after surgery? You can expect to have some pain after surgery. This is normal. The pain is typically worse the day after surgery, and quickly begins to get better. Many studies have found that many patients are able to manage their pain after surgery with Over-the-Counter (OTC) medications such as Tylenol and Motrin. If you have a condition that does not allow you to take Tylenol or Motrin, notify your surgical team.  How will I manage my pain? The best strategy for controlling your pain after surgery is around the clock pain control with Tylenol (acetaminophen) and Motrin (ibuprofen or Advil). Alternating these medications with each other allows you to maximize your pain control. In addition to Tylenol and Motrin, you can use heating pads or ice packs on your incisions to help reduce your pain.  How will I alternate your regular strength over-the-counter pain medication? You will take a dose of pain medication every three hours. Start by taking 650 mg of Tylenol (2 pills of 325 mg) 3 hours later take 600 mg of Motrin (3 pills of 200 mg) 3 hours after taking the Motrin take 650 mg of Tylenol 3 hours after that take 600 mg of Motrin.   - 1 -  See example - if your first dose of Tylenol is at 12:00 PM   12:00 PM Tylenol 650 mg (2 pills of 325 mg)  3:00 PM Motrin 600 mg (3 pills of 200 mg)  6:00 PM Tylenol 650 mg (2 pills of 325 mg)  9:00 PM Motrin 600 mg (3 pills of 200 mg)  Continue alternating every 3 hours   We recommend that you follow this schedule around-the-clock for at least 3 days after surgery, or until you feel that it is no longer needed. Use the table on the last page of this handout to keep track of the medications you are taking. Important: Do not take more than '3000mg'$  of Tylenol or '1000mg'$  of Motrin in a 24-hour period. Do not take ibuprofen/Motrin if you have a history of bleeding stomach ulcers, severe kidney disease, &/or  actively taking a blood thinner  What if I still have pain? If you have pain that is not controlled with the over-the-counter pain medications (Tylenol and Motrin or Advil) you might have what we call "breakthrough" pain. You will receive a prescription for a small amount of an opioid pain medication such as Oxycodone, Tramadol, or Tylenol with Codeine. Use these opioid pills in the first 24 hours after surgery if you have breakthrough pain. Do not take more than 1 pill every 4-6 hours.  If you still have uncontrolled pain after using all opioid pills, don't hesitate to call our staff using the number provided. We will help make sure you are managing your pain in the best way possible, and if necessary, we can provide a prescription for additional pain medication.   Day 1    Time  Name of Medication Number of pills taken  Amount of Acetaminophen  Pain Level   Comments  AM PM  AM PM       AM PM       AM PM       AM PM       AM PM       AM PM       AM PM       Total Daily amount of Acetaminophen Do not take more than  3,000 mg per day      Day 2    Time  Name of Medication Number of pills taken  Amount of Acetaminophen  Pain Level   Comments  AM PM       AM PM       AM PM       AM PM       AM PM       AM PM       AM PM       AM PM       Total Daily amount of Acetaminophen Do not take more than  3,000 mg per day      Day 3    Time  Name of Medication Number of pills taken  Amount of Acetaminophen  Pain Level   Comments  AM PM       AM PM       AM PM       AM PM          AM PM       AM PM       AM PM       AM PM       Total Daily amount of Acetaminophen Do not take more than  3,000 mg per day      Day 4    Time  Name of Medication Number of pills taken  Amount of Acetaminophen  Pain Level   Comments  AM PM       AM PM       AM PM       AM PM       AM PM       AM PM       AM PM       AM PM       Total Daily amount of  Acetaminophen Do not take more than  3,000 mg per day      Day 5    Time  Name of Medication Number of pills taken  Amount of Acetaminophen  Pain Level   Comments  AM PM       AM PM       AM PM       AM PM       AM PM       AM PM       AM PM       AM PM       Total Daily amount of Acetaminophen Do not take more than  3,000 mg per day       Day 6    Time  Name of Medication Number of pills taken  Amount of Acetaminophen  Pain Level  Comments  AM PM       AM PM       AM PM       AM PM       AM PM       AM PM       AM PM       AM PM  Total Daily amount of Acetaminophen Do not take more than  3,000 mg per day      Day 7    Time  Name of Medication Number of pills taken  Amount of Acetaminophen  Pain Level   Comments  AM PM       AM PM       AM PM       AM PM       AM PM       AM PM       AM PM       AM PM       Total Daily amount of Acetaminophen Do not take more than  3,000 mg per day        For additional information about how and where to safely dispose of unused opioid medications - [employer]  Disclaimer: This document contains information and/or instructional materials adapted from [facility] for the typical patient with your condition. It does not replace medical advice from your health care provider because your experience may differ from that of the typical patient. Talk to your health care provider if you have any questions about this document, your condition or your treatment plan. Adapted from [place]

## 2020-09-16 NOTE — Op Note (Signed)
Open Right Indirect Inguinal Hernia Repair with Mesh Procedure Note  Indications: The patient presented with a history of a right, reducible hernia.    Pre-operative Diagnosis: right reducible inguinal hernia  Post-operative Diagnosis: same, right indirect inguinal hernia  Surgeon: [name] MD FACS  Assistants: Sunny Schlein RNFA  Anesthesia: General endotracheal anesthesia   Surgeon: Leighton Ruff. Redmond Pulling, MD, FACS  Procedure Details  The patient was seen again in the Holding Room. The risks, benefits, complications, treatment options, and expected outcomes were discussed with the patient. The possibilities of reaction to medication, pulmonary aspiration, perforation of viscus, bleeding, recurrent infection, the need for additional procedures, and development of a complication requiring transfusion or further operation were discussed with the patient and/or family. The likelihood of success in repairing the hernia and returning the patient to their previous functional status is good.  There was concurrence with the proposed plan, and informed consent was obtained. The site of surgery was properly noted/marked. The patient was taken to the Operating Room, identified as Gregory Cuevas, and the procedure verified as right inguinal hernia repair. A Time Out was held and the above information confirmed. The patient received a TAP block in holding area.   The patient was placed in the supine position and underwent induction of anesthesia. The lower abdomen and groin was prepped with Chloraprep and draped in the standard fashion, and 0.25% Marcaine with epinephrine was used to anesthetize the skin over the mid-portion of the inguinal canal. An oblique incision was made. Dissection was carried down through the subcutaneous tissue with cautery to the external oblique fascia.  We opened the external oblique fascia along the direction of its fibers to the external ring.  The spermatic cord was circumferentially  dissected bluntly and retracted with a Penrose drain.  The floor of the inguinal canal was inspected & there was no direct defect just some laxity in the inguinal floor.  We skeletonized the spermatic cord and isolated a hernia sac and discarded a small cord lipoma. The hernia sac was reduced.  We used a 3 x 6 inch piece of Ultrapro mesh, which was cut into a keyhole shape.  This was secured with 2-0 Prolene, beginning at the pubic tubercle, running this along the shelving edge inferiorly. Superiorly, the mesh was secured to the internal oblique fascia with interrupted 2-0 Prolene sutures.  The tails of the mesh were sutured together behind the spermatic cord.  The mesh was tucked underneath the external oblique fascia laterally. The ilioinguinal nerve was identified and preserved. The external oblique fascia was reapproximated with 2-0 Vicryl.  3-0 Vicryl was used to close the subcutaneous tissues and 4-0 Monocryl was used to close the skin in subcuticular fashion.  Dermabond was used to seal the incision.  A clean dressing was applied.  The patient was then extubated and brought to the recovery room in stable condition.  All sponge, instrument, and needle counts were correct prior to closure and at the conclusion of the case.   Estimated Blood Loss: Minimal                 Complications: None; patient tolerated the procedure well.         Disposition: PACU - hemodynamically stable.         Condition: stable  Leighton Ruff. Redmond Pulling, MD, FACS General, Bariatric, & Minimally Invasive Surgery [facility] [place]

## 2020-09-16 NOTE — Transfer of Care (Signed)
Immediate Anesthesia Transfer of Care Note  Patient: Gregory Cuevas  Procedure(s) Performed: Procedure(s) with comments: OPEN REPAIR RECURRENT RIGHT INGUINAL HERNIA WITH MESH (Right) - TAP BLOCK  Patient Location: PACU  Anesthesia Type:GA combined with regional for post-op pain  Level of Consciousness:  sedated, patient cooperative and responds to stimulation  Airway & Oxygen Therapy:Patient Spontanous Breathing and Patient connected to face mask oxgen  Post-op Assessment:  Report given to PACU RN and Post -op Vital signs reviewed and stable  Post vital signs:  Reviewed and stable  Last Vitals:  Vitals:   09/16/20 0543  BP: 140/64  Pulse: (!) 55  Resp: 11  Temp: 36.4 C  SpO2: 123XX123    Complications: No apparent anesthesia complications

## 2020-09-16 NOTE — Anesthesia Procedure Notes (Signed)
Anesthesia Regional Block: TAP block   Pre-Anesthetic Checklist: , timeout performed,  Correct Patient, Correct Site, Correct Laterality,  Correct Procedure, Correct Position, site marked,  Risks and benefits discussed,  Surgical consent,  Pre-op evaluation,  At surgeon's request and post-op pain management  Laterality: Right  Prep: chloraprep       Needles:  Injection technique: Single-shot  Needle Type: Echogenic Stimulator Needle     Needle Length: 10cm  Needle Gauge: 21     Additional Needles:   Procedures:,,,, ultrasound used (permanent image in chart),,    Narrative:  Start time: 09/16/2020 6:42 AM End time: 09/16/2020 6:52 AM Injection made incrementally with aspirations every 5 mL.  Performed by: Personally

## 2020-09-16 NOTE — Anesthesia Procedure Notes (Signed)
Procedure Name: LMA Insertion Date/Time: 09/16/2020 7:45 AM Performed by: Lavina Hamman, CRNA Pre-anesthesia Checklist: Patient identified, Emergency Drugs available, Suction available and Patient being monitored Patient Re-evaluated:Patient Re-evaluated prior to induction Oxygen Delivery Method: Circle System Utilized Preoxygenation: Pre-oxygenation with 100% oxygen Induction Type: IV induction Ventilation: Mask ventilation without difficulty LMA: LMA inserted LMA Size: 4.0 Number of attempts: 1 Airway Equipment and Method: Bite block Placement Confirmation: positive ETCO2 Tube secured with: Tape Dental Injury: Teeth and Oropharynx as per pre-operative assessment

## 2020-09-16 NOTE — Interval H&P Note (Signed)
History and Physical Interval Note:  09/16/2020 7:32 AM  Gregory Cuevas  has presented today for surgery, with the diagnosis of RECURRENT RIGHT INGUINAL HERNIA.  The various methods of treatment have been discussed with the patient and family. After consideration of risks, benefits and other options for treatment, the patient has consented to  Procedure(s) with comments: OPEN REPAIR RECURRENT RIGHT INGUINAL HERNIA WITH MESH (Right) - TAP BLOCK as a surgical intervention.  The patient's history has been reviewed, patient examined, no change in status, stable for surgery.  I have reviewed the patient's chart and labs.  Questions were answered to the patient's satisfaction.    Leighton Ruff. Redmond Pulling, MD, FACS General, Bariatric, & Minimally Invasive Surgery [facility]  [name]

## 2020-09-16 NOTE — Anesthesia Postprocedure Evaluation (Signed)
Anesthesia Post Note  Patient: Gregory Cuevas  Procedure(s) Performed: OPEN REPAIR RECURRENT RIGHT INGUINAL HERNIA WITH MESH (Right: Abdomen)     Patient location during evaluation: PACU Anesthesia Type: General Level of consciousness: sedated Pain management: pain level controlled Vital Signs Assessment: post-procedure vital signs reviewed and stable Respiratory status: spontaneous breathing and respiratory function stable Cardiovascular status: stable Postop Assessment: no apparent nausea or vomiting Anesthetic complications: no   No notable events documented.  Last Vitals:  Vitals:   09/16/20 1020 09/16/20 1030  BP:  (!) 159/68  Pulse:  (!) 53  Resp:    Temp: 36.6 C   SpO2:  100%    Last Pain:  Vitals:   09/16/20 1020  TempSrc: Oral  PainSc:                  [place]

## 2020-09-17 ENCOUNTER — Encounter (HOSPITAL_COMMUNITY): Payer: Self-pay | Admitting: General Surgery

## 2020-09-23 ENCOUNTER — Telehealth: Payer: Self-pay | Admitting: Lab

## 2020-09-23 NOTE — Progress Notes (Signed)
  Chronic Care Management   Outreach Note  09/23/2020 Name: Gregory Cuevas MRN: [id number] DOB: January 06, 1934  Referred by: Donella Stade, PA-C Reason for referral : Medication Management   An unsuccessful telephone outreach was attempted today. The patient was referred to the pharmacist for assistance with care management and care coordination.   Follow Up Plan:   [place]

## 2020-09-27 DIAGNOSIS — R338 Other retention of urine: Secondary | ICD-10-CM | POA: Diagnosis not present

## 2020-10-05 DIAGNOSIS — H10402 Unspecified chronic conjunctivitis, left eye: Secondary | ICD-10-CM | POA: Diagnosis not present

## 2020-10-05 DIAGNOSIS — H119 Unspecified disorder of conjunctiva: Secondary | ICD-10-CM | POA: Diagnosis not present

## 2020-10-21 DIAGNOSIS — H119 Unspecified disorder of conjunctiva: Secondary | ICD-10-CM | POA: Diagnosis not present

## 2020-10-26 ENCOUNTER — Ambulatory Visit: Payer: Medicare Other | Admitting: Physician Assistant

## 2020-11-01 DIAGNOSIS — R338 Other retention of urine: Secondary | ICD-10-CM | POA: Diagnosis not present

## 2020-11-02 ENCOUNTER — Ambulatory Visit (INDEPENDENT_AMBULATORY_CARE_PROVIDER_SITE_OTHER): Payer: Medicare Other | Admitting: Physician Assistant

## 2020-11-02 ENCOUNTER — Other Ambulatory Visit: Payer: Self-pay

## 2020-11-02 VITALS — BP 138/54 | HR 53 | Temp 97.8°F | Wt 124.0 lb

## 2020-11-02 DIAGNOSIS — E7800 Pure hypercholesterolemia, unspecified: Secondary | ICD-10-CM

## 2020-11-02 DIAGNOSIS — I517 Cardiomegaly: Secondary | ICD-10-CM | POA: Diagnosis not present

## 2020-11-02 DIAGNOSIS — I10 Essential (primary) hypertension: Secondary | ICD-10-CM

## 2020-11-02 DIAGNOSIS — Z79899 Other long term (current) drug therapy: Secondary | ICD-10-CM

## 2020-11-02 DIAGNOSIS — Z23 Encounter for immunization: Secondary | ICD-10-CM | POA: Diagnosis not present

## 2020-11-02 DIAGNOSIS — I351 Nonrheumatic aortic (valve) insufficiency: Secondary | ICD-10-CM

## 2020-11-02 MED ORDER — PRAVASTATIN SODIUM 40 MG PO TABS: 40.0000 mg | ORAL_TABLET | Freq: Every day | ORAL | 1 refills | Status: DC

## 2020-11-02 MED ORDER — LISINOPRIL 5 MG PO TABS: ORAL_TABLET | ORAL | 1 refills | Status: DC

## 2020-11-02 NOTE — Progress Notes (Signed)
Subjective:    Patient ID: Gregory Cuevas, male    DOB: 04/18/1933, 87 y.o.   MRN: [id number]  HPI Pt is a 87 yo male who speaks spanish but his wife is here to interpret. Pt has HTN, ASD, Aortic insufficiency and regurgitation and sees cardiology every 6 months. He is out of lisinopril and Pravachol.   He is doing well. No concerns or complaints. No CP, palpitations, headaches, dizziness.   He just had his right inguinal hernia repair and doing well.   .. Active Ambulatory Problems    Diagnosis Date Noted   Adenomatous polyp of colon 02/10/2010   Hyperlipidemia 02/11/2010   Cardiomegaly 76/22/6333   Diastolic dysfunction 54/56/2563   Mild aortic regurgitation 03/02/2014   ASD (atrial septal defect) 03/02/2014   Aortic insufficiency 03/02/2014   Essential hypertension, benign 11/03/2014   Acute depression 03/01/2015   Acute anxiety 03/01/2015   Constipation 03/18/2015   Nocturia 03/18/2015   Acute prostatitis 03/18/2015   Hyponatremia 06/22/2016   Fracture of great toe, right, closed 07/25/2016   Blind left eye 11/14/2016   Vision changes 11/14/2016   Chronic venous insufficiency 04/10/2017   Bilateral lower extremity edema 04/10/2017   Dizziness 04/10/2017   History of retinal detachment 04/10/2017   Benign prostatic hyperplasia without lower urinary tract symptoms 04/10/2017   Weakness 08/20/2019   Inguinal hernia, right 08/03/2020   Resolved Ambulatory Problems    Diagnosis Date Noted   Essential hypertension, benign 02/10/2010   DARK URINE 02/10/2010   Rhinorrhea 01/26/2014   Nasal congestion 01/26/2014   Adenomatous polyp of colon 08/04/2015   Past Medical History:  Diagnosis Date   AMI (acute myocardial infarction) ([place]) 1980   Aortic regurgitation    Colon cancer ([place]) 1990's   Detached retina    Hypertension       Review of Systems See HPI.     Objective:   Physical Exam Vitals reviewed.  Constitutional:      Appearance: Normal appearance.  HENT:      Head: Normocephalic.  Neck:     Vascular: No carotid bruit.  Cardiovascular:     Rate and Rhythm: Normal rate and regular rhythm.     Pulses: Normal pulses.     Heart sounds: Murmur heard.  Pulmonary:     Effort: Pulmonary effort is normal.     Breath sounds: Normal breath sounds.  Abdominal:     Comments: Well healed right inguinal incision.   Neurological:     General: No focal deficit present.     Mental Status: He is alert and oriented to person, place, and time.  Psychiatric:        Mood and Affect: Mood normal.   .. Depression screen [facility] 2/9 06/23/2020 04/26/2020 04/10/2017 07/07/2016 04/16/2013  Decreased Interest 0 1 0 0 0  Down, Depressed, Hopeless 1 0 0 0 0  PHQ - 2 Score 1 1 0 0 0  Altered sleeping 0 1 1 - -  Tired, decreased energy 0 1 0 - -  Change in appetite 0 0 3 - -  Feeling bad or failure about yourself  0 0 0 - -  Trouble concentrating 0 0 0 - -  Moving slowly or fidgety/restless 0 0 0 - -  Suicidal thoughts 0 0 0 - -  PHQ-9 Score 1 3 4  - -  Difficult doing work/chores Not difficult at all Not difficult at all Not difficult at all - -  Assessment & Plan:  Marland KitchenMarland KitchenMaicol was seen today for hypertension.  Diagnoses and all orders for this visit:  Essential hypertension, benign -     lisinopril (ZESTRIL) 5 MG tablet; TAKE 1 TABLET(5 MG) BY MOUTH DAILY  Medication management -     COMPLETE METABOLIC PANEL WITH GFR  Cardiomegaly -     lisinopril (ZESTRIL) 5 MG tablet; TAKE 1 TABLET(5 MG) BY MOUTH DAILY  Nonrheumatic aortic valve insufficiency -     lisinopril (ZESTRIL) 5 MG tablet; TAKE 1 TABLET(5 MG) BY MOUTH DAILY  Mild aortic regurgitation -     lisinopril (ZESTRIL) 5 MG tablet; TAKE 1 TABLET(5 MG) BY MOUTH DAILY  Need for influenza vaccination -     Flu Vaccine QUAD High Dose(Fluad)  Pure hypercholesterolemia -     pravastatin (PRAVACHOL) 40 MG tablet; Take 1 tablet (40 mg total) by mouth daily.  BP looks good.  Cmp ordered.  Refilled  lisinopril.  Lipid panel UTD.  Refilled statin.  Flu shot given today.  Covid with 2 boosters.  Follow up in 6 months.

## 2020-11-03 LAB — COMPLETE METABOLIC PANEL WITH GFR
AG Ratio: 1.8 (calc) (ref 1.0–2.5)
ALT: 9 U/L (ref 9–46)
AST: 14 U/L (ref 10–35)
Albumin: 4 g/dL (ref 3.6–5.1)
Alkaline phosphatase (APISO): 56 U/L (ref 35–144)
BUN: 20 mg/dL (ref 7–25)
CO2: 29 mmol/L (ref 20–32)
Calcium: 9.6 mg/dL (ref 8.6–10.3)
Chloride: 102 mmol/L (ref 98–110)
Creat: 0.93 mg/dL (ref 0.70–1.22)
Globulin: 2.2 g/dL (calc) (ref 1.9–3.7)
Glucose, Bld: 85 mg/dL (ref 65–99)
Potassium: 4.6 mmol/L (ref 3.5–5.3)
Sodium: 138 mmol/L (ref 135–146)
Total Bilirubin: 0.9 mg/dL (ref 0.2–1.2)
Total Protein: 6.2 g/dL (ref 6.1–8.1)
eGFR: 80 mL/min/{1.73_m2} (ref >=60)

## 2020-11-03 NOTE — Progress Notes (Signed)
Gregory Cuevas,   Kidney, liver, glucose look great.

## 2021-02-14 ENCOUNTER — Ambulatory Visit (INDEPENDENT_AMBULATORY_CARE_PROVIDER_SITE_OTHER): Payer: Medicare Other | Admitting: Physician Assistant

## 2021-02-14 ENCOUNTER — Encounter: Payer: Self-pay | Admitting: Physician Assistant

## 2021-02-14 ENCOUNTER — Other Ambulatory Visit: Payer: Self-pay

## 2021-02-14 VITALS — BP 141/69 | HR 58 | Ht 64.0 in | Wt 127.0 lb

## 2021-02-14 DIAGNOSIS — H6123 Impacted cerumen, bilateral: Secondary | ICD-10-CM

## 2021-02-14 NOTE — Progress Notes (Signed)
Subjective:    Patient ID: Gregory Cuevas, male    DOB: [birth year], 88 y.o.   MRN: [id number]  HPI Pt is a 88 yo male with a history of hearing loss due to wax build up in ears. Wife and son have noticed his hearing worsening over the past few months. He has not had his ears irrigated in over a year. No ear pain. He uses qtips but not getting a lot out.   .. 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/14/2016   Vision changes 11/14/2016   Chronic venous insufficiency 04/10/2017   Bilateral lower extremity edema 04/10/2017   Dizziness 04/10/2017   History of retinal detachment 04/10/2017   Benign prostatic hyperplasia without lower urinary tract symptoms 04/10/2017   Weakness 08/20/2019   Inguinal hernia, right 08/03/2020   Bilateral hearing loss due to cerumen impaction 02/14/2021   Resolved Ambulatory Problems    Diagnosis Date Noted   Essential hypertension, benign 02/10/2010   DARK URINE 02/10/2010   Rhinorrhea 01/26/2014   Nasal congestion 01/26/2014   Adenomatous polyp of colon 08/04/2015   Past Medical History:  Diagnosis Date   AMI (acute myocardial infarction) ([place]) 1980   Aortic regurgitation    Colon cancer ([place]) 1990's   Detached retina    Hypertension        Review of Systems  All other systems reviewed and are negative.     Objective:   Physical Exam Vitals reviewed.  Constitutional:      Appearance: Normal appearance.  HENT:      Right Ear: There is impacted cerumen.     Left Ear: There is impacted cerumen.  Cardiovascular:     Rate and Rhythm: Normal rate.     Pulses: Normal pulses.     Heart sounds: Murmur heard.  Pulmonary:     Effort: Pulmonary effort is normal.  Neurological:     General: No focal deficit present.     Mental Status: He is alert and oriented to person, place, and time.     [name].Cerumen Removal Template: Indication: Cerumen impaction of the ear(s) Medical necessity statement: On physical examination, cerumen impairs clinically significant portions of the external auditory canal, and tympanic membrane. Noted obstructive, copious cerumen that cannot be removed without magnification and instrumentations requiring physician skills Consent: Discussed benefits and risks of procedure and verbal consent obtained Procedure: Patient was prepped for the procedure. Utilized an otoscope to assess and take note of the ear canal, the tympanic membrane, and the presence, amount, and placement of the cerumen. Gentle water irrigation and soft plastic curette was utilized to remove cerumen.  Post procedure examination shows cerumen was completely removed. Patient tolerated procedure well. The patient is made aware that they may experience temporary vertigo, temporary hearing loss, and temporary discomfort. If these symptom last for more than 24 hours to call the clinic or proceed to the ED.      Assessment & Plan:  Marland KitchenMarland KitchenDiagnoses and all orders for this visit:  Bilateral hearing loss due to cerumen impaction   Ears started to bleed so irrigation was stopped. Still some residual impaction. Start debrox OTC. Follow up as needed.

## 2021-02-14 NOTE — Patient Instructions (Signed)
Earwax Buildup, Adult ?The ears produce a substance called earwax that helps keep bacteria out of the ear and protects the skin in the ear canal. Occasionally, earwax can build up in the ear and cause discomfort or hearing loss. ?What are the causes? ?This condition is caused by a buildup of earwax. Ear canals are self-cleaning. Ear wax is made in the outer part of the ear canal and generally falls out in small amounts over time. ?When the self-cleaning mechanism is not working, earwax builds up and can cause decreased hearing and discomfort. Attempting to clean ears with cotton swabs can push the earwax deep into the ear canal and cause decreased hearing and pain. ?What increases the risk? ?This condition is more likely to develop in people who: ?Clean their ears often with cotton swabs. ?Pick at their ears. ?Use earplugs or in-ear headphones often, or wear hearing aids. ?The following factors may also make you more likely to develop this condition: ?Being male. ?Being of older age. ?Naturally producing more earwax. ?Having narrow ear canals. ?Having earwax that is overly thick or sticky. ?Having excess hair in the ear canal. ?Having eczema. ?Being dehydrated. ?What are the signs or symptoms? ?Symptoms of this condition include: ?Reduced or muffled hearing. ?A feeling of fullness in the ear or feeling that the ear is plugged. ?Fluid coming from the ear. ?Ear pain or an itchy ear. ?Ringing in the ear. ?Coughing. ?Balance problems. ?An obvious piece of earwax that can be seen inside the ear canal. ?How is this diagnosed? ?This condition may be diagnosed based on: ?Your symptoms. ?Your medical history. ?An ear exam. During the exam, your health care provider will look into your ear with an instrument called an otoscope. ?You may have tests, including a hearing test. ?How is this treated? ?This condition may be treated by: ?Using ear drops to soften the earwax. ?Having the earwax removed by a health care provider. The  health care provider may: ?Flush the ear with water. ?Use an instrument that has a loop on the end (curette). ?Use a suction device. ?Having surgery to remove the wax buildup. This may be done in severe cases. ?Follow these instructions at home: ? ?Take over-the-counter and prescription medicines only as told by your health care provider. ?Do not put any objects, including cotton swabs, into your ear. You can clean the opening of your ear canal with a washcloth or facial tissue. ?Follow instructions from your health care provider about cleaning your ears. Do not overclean your ears. ?Drink enough fluid to keep your urine pale yellow. This will help to thin the earwax. ?Keep all follow-up visits as told. If earwax builds up in your ears often or if you use hearing aids, consider seeing your health care provider for routine, preventive ear cleanings. Ask your health care provider how often you should schedule your cleanings. ?If you have hearing aids, clean them according to instructions from the manufacturer and your health care provider. ?Contact a health care provider if: ?You have ear pain. ?You develop a fever. ?You have pus or other fluid coming from your ear. ?You have hearing loss. ?You have ringing in your ears that does not go away. ?You feel like the room is spinning (vertigo). ?Your symptoms do not improve with treatment. ?Get help right away if: ?You have bleeding from the affected ear. ?You have severe ear pain. ?Summary ?Earwax can build up in the ear and cause discomfort or hearing loss. ?The most common symptoms of this condition include   reduced or muffled hearing, a feeling of fullness in the ear, or feeling that the ear is plugged. ?This condition may be diagnosed based on your symptoms, your medical history, and an ear exam. ?This condition may be treated by using ear drops to soften the earwax or by having the earwax removed by a health care provider. ?Do not put any objects, including cotton  swabs, into your ear. You can clean the opening of your ear canal with a washcloth or facial tissue. ?This information is not intended to replace advice given to you by your health care provider. Make sure you discuss any questions you have with your health care provider. ?Document Revised: 04/22/2019 Document Reviewed: 04/22/2019 ?Elsevier Patient Education ? 2022 [employer] ? ?

## 2021-03-28 ENCOUNTER — Telehealth: Payer: Self-pay | Admitting: Physician Assistant

## 2021-03-28 DIAGNOSIS — I351 Nonrheumatic aortic (valve) insufficiency: Secondary | ICD-10-CM

## 2021-03-28 DIAGNOSIS — E7800 Pure hypercholesterolemia, unspecified: Secondary | ICD-10-CM

## 2021-03-28 DIAGNOSIS — I10 Essential (primary) hypertension: Secondary | ICD-10-CM

## 2021-03-28 DIAGNOSIS — I517 Cardiomegaly: Secondary | ICD-10-CM

## 2021-03-28 MED ORDER — LISINOPRIL 5 MG PO TABS: ORAL_TABLET | ORAL | 0 refills | Status: DC

## 2021-03-28 MED ORDER — PRAVASTATIN SODIUM 40 MG PO TABS: 40.0000 mg | ORAL_TABLET | Freq: Every day | ORAL | 0 refills | Status: DC

## 2021-03-28 NOTE — Telephone Encounter (Signed)
Sent 30 day supply to pharmacy.

## 2021-03-28 NOTE — Telephone Encounter (Signed)
Patient's son informed .

## 2021-03-28 NOTE — Telephone Encounter (Signed)
Patients son arrived in office and stated that his father needs refills on the Lisinopril and Pravastatin - he stated that pharmacy told him to contact our office. The patient is scheduled in April for an office visit - lmr. ?

## 2021-05-02 ENCOUNTER — Other Ambulatory Visit: Payer: Self-pay | Admitting: Physician Assistant

## 2021-05-02 DIAGNOSIS — E7800 Pure hypercholesterolemia, unspecified: Secondary | ICD-10-CM

## 2021-05-02 DIAGNOSIS — I10 Essential (primary) hypertension: Secondary | ICD-10-CM

## 2021-05-02 DIAGNOSIS — I351 Nonrheumatic aortic (valve) insufficiency: Secondary | ICD-10-CM

## 2021-05-02 DIAGNOSIS — I517 Cardiomegaly: Secondary | ICD-10-CM

## 2021-05-03 ENCOUNTER — Ambulatory Visit (INDEPENDENT_AMBULATORY_CARE_PROVIDER_SITE_OTHER): Payer: Medicare Other | Admitting: Physician Assistant

## 2021-05-03 ENCOUNTER — Encounter: Payer: Self-pay | Admitting: Physician Assistant

## 2021-05-03 VITALS — BP 141/52 | HR 57 | Ht 64.0 in | Wt 126.0 lb

## 2021-05-03 DIAGNOSIS — E7800 Pure hypercholesterolemia, unspecified: Secondary | ICD-10-CM

## 2021-05-03 DIAGNOSIS — Z1329 Encounter for screening for other suspected endocrine disorder: Secondary | ICD-10-CM | POA: Diagnosis not present

## 2021-05-03 DIAGNOSIS — Z125 Encounter for screening for malignant neoplasm of prostate: Secondary | ICD-10-CM | POA: Diagnosis not present

## 2021-05-03 DIAGNOSIS — L710 Perioral dermatitis: Secondary | ICD-10-CM

## 2021-05-03 DIAGNOSIS — Z79899 Other long term (current) drug therapy: Secondary | ICD-10-CM | POA: Diagnosis not present

## 2021-05-03 DIAGNOSIS — I10 Essential (primary) hypertension: Secondary | ICD-10-CM

## 2021-05-03 MED ORDER — TRIAMCINOLONE ACETONIDE 0.1 % EX OINT: 1.0000 "application " | TOPICAL_OINTMENT | Freq: Two times a day (BID) | CUTANEOUS | 0 refills | Status: DC

## 2021-05-03 NOTE — Progress Notes (Signed)
? ?Established Patient Office Visit ? ?Subjective   ?Patient ID: Gregory Cuevas, male    DOB: 02-Aug-1933  Age: 88 y.o. MRN: [id number] ? ?Chief Complaint  ?Patient presents with  ? Follow-up  ? ? ?HPI ?Pt is a 88 yo male with HTN, AR,HLD who presents to the clinic for medication follow up.  ? ?Pt is doing really well. He is accompanied by his son. He does have a rash around lips and mouth that has been coming up after being outdoors for a while. This has been going on since he was young but seems to be getting worse.  ? ?Pt denies any CP, palpitations, SOB, headaches, dizziness.  ? ?.. ?Active Ambulatory Problems  ?  Diagnosis Date Noted  ? Adenomatous polyp of colon 02/10/2010  ? Hyperlipidemia 02/11/2010  ? Cardiomegaly 01/19/2014  ? Diastolic dysfunction 29/92/4268  ? Mild aortic regurgitation 03/02/2014  ? ASD (atrial septal defect) 03/02/2014  ? Aortic insufficiency 03/02/2014  ? Essential hypertension, benign 11/03/2014  ? Acute depression 03/01/2015  ? Acute anxiety 03/01/2015  ? Constipation 03/18/2015  ? Nocturia 03/18/2015  ? Acute prostatitis 03/18/2015  ? Hyponatremia 06/22/2016  ? Fracture of great toe, right, closed 07/25/2016  ? 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 Vision changes 11/14/2016  ? Chronic venous insufficiency 04/10/2017  ? Bilateral lower extremity edema 04/10/2017  ? Dizziness 04/10/2017  ? History of retinal detachment 04/10/2017  ? Benign prostatic hyperplasia without lower urinary tract symptoms 04/10/2017  ? Weakness 08/20/2019  ? Inguinal hernia, right 08/03/2020  ? Bilateral hearing loss due to cerumen impaction 02/14/2021  ? ?Resolved Ambulatory Problems  ?  Diagnosis Date Noted  ? Essential hypertension, benign 02/10/2010  ? DARK URINE 02/10/2010  ? Rhinorrhea 01/26/2014  ? Nasal congestion 01/26/2014  ? Adenomatous polyp of colon 08/04/2015  ? ?Past Medical History:  ?Diagnosis Date  ? AMI (acute myocardial infarction) ([place]) 1980  ? Aortic regurgitation   ? Colon cancer ([place]) 1990's  ? Detached  retina   ? Hypertension   ? ? ? ?Review of Systems  ?All other systems reviewed and are negative. ? ?  ?Objective:  ?  ? ?BP (!) 141/52   Pulse (!) 57   Ht '5\' 4"'$  (1.626 m)   Wt 126 lb (57.2 kg)   SpO2 100%   BMI 21.63 kg/m?  ? ? ?Physical Exam ?Vitals reviewed.  ?Constitutional:   ?   Appearance: Normal appearance.  ?HENT:  ?   Head: Normocephalic.  ?   Nose: Nose normal.  ?   Mouth/Throat:  ?   Mouth: Mucous membranes are moist.  ?   Pharynx: No oropharyngeal exudate or posterior oropharyngeal erythema.  ?Cardiovascular:  ?   Rate and Rhythm: Normal rate and regular rhythm.  ?   Heart sounds: Murmur heard.  ?Pulmonary:  ?   Effort: Pulmonary effort is normal.  ?   Breath sounds: Normal breath sounds.  ?Musculoskeletal:  ?   Right lower leg: No edema.  ?   Left lower leg: No edema.  ?Neurological:  ?   General: No focal deficit present.  ?   Mental Status: He is alert and oriented to person, place, and time.  ?Psychiatric:     ?   Mood and Affect: Mood normal.  ? ? ?.. ? RO-AUA SYMPTOM   ? ? [place] Name 05/03/21 1000  ?  ?  ?  ? During the last Month  ? Sensation of Bladder not Empty Not at all    ?  Urinate<2 hours after last Not at all    ? Mult. stop/start when voiding Not at all    ? Difficult to postpone voiding Not at all    ? Weak urinary stream Not at all    ? Push/strain to begin urination Not at all    ? Times per night up to urinate Less than half the time    ?  ? OTHER  ? Total Score 2    ? ?  ?  ? ?  ? ?.. ? ?  05/03/2021  ? 10:56 AM 06/23/2020  ?  1:09 PM 04/26/2020  ? 10:40 AM 04/10/2017  ?  9:29 AM 07/07/2016  ? 10:32 AM  ?Depression screen PHQ 2/9  ?Decreased Interest 1 0 1 0 0  ?Down, Depressed, Hopeless 1 1 0 0 0  ?PHQ - 2 Score '2 1 1 '$ 0 0  ?Altered sleeping 3 0 1 1   ?Tired, decreased energy 1 0 1 0   ?Change in appetite 1 0 0 3   ?Feeling bad or failure about yourself  0 0 0 0   ?Trouble concentrating 0 0 0 0   ?Moving slowly or fidgety/restless 1 0 0 0   ?Suicidal thoughts 0 0 0 0   ?PHQ-9 Score '8 1  3 4   '$ ?Difficult doing work/chores Somewhat difficult Not difficult at all Not difficult at all Not difficult at all   ? ?.. ? ?  05/03/2021  ? 10:57 AM 04/26/2020  ? 10:41 AM 04/10/2017  ?  9:30 AM  ?GAD 7 : Generalized Anxiety Score  ?Nervous, Anxious, on Edge '1 1 3  '$ ?Control/stop worrying '1 1 3  '$ ?Worry too much - different things 1 0 1  ?Trouble relaxing 0 0 0  ?Restless 0 1 0  ?Easily annoyed or irritable 1 0 0  ?Afraid - awful might happen 0 0 0  ?Total GAD 7 Score '4 3 7  '$ ?Anxiety Difficulty Somewhat difficult Not difficult at all Not difficult at all  ? ? ? ? ?  ?Assessment & Plan:  ?..Khaza was seen today for follow-up. ? ?Diagnoses and all orders for this visit: ? ?Essential hypertension, benign ?-     COMPLETE METABOLIC PANEL WITH GFR ? ?Pure hypercholesterolemia ?-     Lipid Panel w/reflex Direct LDL ? ?Screening PSA (prostate specific antigen) ?-     PSA ? ?Thyroid disorder screen ?-     TSH ? ?Medication management ?-     PSA ?-     TSH ?-     Lipid Panel w/reflex Direct LDL ?-     COMPLETE METABOLIC PANEL WITH GFR ?-     CBC with Differential/Platelet ? ?Perioral dermatitis ?-     triamcinolone ointment (KENALOG) 0.1 %; Apply 1 application. topically 2 (two) times daily. To affected areas ? ? ?Fasting labs ordered today ?PHQ/GAD numbers up but patient does not feel  ?like he needs medication ?AUA no concerns ?Needs Tdap and Shingrix at pharmacy ?BP up today but has not taken BP medication ?Discussed peri oral dermatitis use as needed topical steroid with vasoline or aquafor.  ? ? ? ?Return in about 6 months (around 11/02/2021).  ? ? ?[name], PA-C ? ?

## 2021-05-03 NOTE — Patient Instructions (Addendum)
Can get Tetanus and Shingles vaccines at the pharmacy.  ? ? ?

## 2021-05-04 ENCOUNTER — Other Ambulatory Visit: Payer: Self-pay | Admitting: Physician Assistant

## 2021-05-04 DIAGNOSIS — I517 Cardiomegaly: Secondary | ICD-10-CM

## 2021-05-04 DIAGNOSIS — I10 Essential (primary) hypertension: Secondary | ICD-10-CM

## 2021-05-04 DIAGNOSIS — E7800 Pure hypercholesterolemia, unspecified: Secondary | ICD-10-CM

## 2021-05-04 DIAGNOSIS — I351 Nonrheumatic aortic (valve) insufficiency: Secondary | ICD-10-CM

## 2021-05-04 LAB — CBC WITH DIFFERENTIAL/PLATELET
Absolute Monocytes: 414 cells/uL (ref 200–950)
Basophils Absolute: 42 cells/uL (ref 0–200)
Basophils Relative: 0.9 %
Eosinophils Absolute: 212 cells/uL (ref 15–500)
Eosinophils Relative: 4.5 %
HCT: 42 % (ref 38.5–50.0)
Hemoglobin: 13.9 g/dL (ref 13.2–17.1)
Lymphs Abs: 1589 cells/uL (ref 850–3900)
MCH: 30 pg (ref 27.0–33.0)
MCHC: 33.1 g/dL (ref 32.0–36.0)
MCV: 90.5 fL (ref 80.0–100.0)
MPV: 10.7 fL (ref 7.5–12.5)
Monocytes Relative: 8.8 %
Neutro Abs: 2444 cells/uL (ref 1500–7800)
Neutrophils Relative %: 52 %
Platelets: 214 10*3/uL (ref 140–400)
RBC: 4.64 10*6/uL (ref 4.20–5.80)
RDW: 12.4 % (ref 11.0–15.0)
Total Lymphocyte: 33.8 %
WBC: 4.7 10*3/uL (ref 3.8–10.8)

## 2021-05-04 LAB — LIPID PANEL W/REFLEX DIRECT LDL
Cholesterol: 193 mg/dL (ref <200)
HDL: 89 mg/dL (ref >=40)
LDL Cholesterol (Calc): 87 mg/dL (calc)
Non-HDL Cholesterol (Calc): 104 mg/dL (calc) (ref <130)
Total CHOL/HDL Ratio: 2.2 (calc) (ref <5.0)
Triglycerides: 83 mg/dL (ref <150)

## 2021-05-04 LAB — COMPLETE METABOLIC PANEL WITH GFR
AG Ratio: 2.2 (calc) (ref 1.0–2.5)
ALT: 10 U/L (ref 9–46)
AST: 18 U/L (ref 10–35)
Albumin: 4.3 g/dL (ref 3.6–5.1)
Alkaline phosphatase (APISO): 65 U/L (ref 35–144)
BUN: 15 mg/dL (ref 7–25)
CO2: 28 mmol/L (ref 20–32)
Calcium: 9.4 mg/dL (ref 8.6–10.3)
Chloride: 101 mmol/L (ref 98–110)
Creat: 0.94 mg/dL (ref 0.70–1.22)
Globulin: 2 g/dL (calc) (ref 1.9–3.7)
Glucose, Bld: 83 mg/dL (ref 65–99)
Potassium: 5 mmol/L (ref 3.5–5.3)
Sodium: 136 mmol/L (ref 135–146)
Total Bilirubin: 1 mg/dL (ref 0.2–1.2)
Total Protein: 6.3 g/dL (ref 6.1–8.1)
eGFR: 79 mL/min/{1.73_m2} (ref >=60)

## 2021-05-04 LAB — TSH: TSH: 0.75 mIU/L (ref 0.40–4.50)

## 2021-05-04 LAB — PSA: PSA: 0.97 ng/mL (ref <=4.00)

## 2021-05-04 MED ORDER — PRAVASTATIN SODIUM 40 MG PO TABS: 40.0000 mg | ORAL_TABLET | Freq: Every day | ORAL | 3 refills | Status: DC

## 2021-05-04 MED ORDER — LISINOPRIL 5 MG PO TABS: ORAL_TABLET | ORAL | 3 refills | Status: DC

## 2021-05-04 NOTE — Progress Notes (Signed)
Normal prostate enzyme.  ?Normal thyroid. ?Cholesterol looks great.  ?Kidney, liver, glucose looks wonderful.  ?Hemoglobin looks great.  ?Refills sent.  ?

## 2021-06-28 ENCOUNTER — Ambulatory Visit (INDEPENDENT_AMBULATORY_CARE_PROVIDER_SITE_OTHER): Payer: Medicare Other | Admitting: Physician Assistant

## 2021-06-28 DIAGNOSIS — Z0000 Encounter for general adult medical examination without abnormal findings: Secondary | ICD-10-CM

## 2021-06-28 NOTE — Patient Instructions (Addendum)
[place] Maintenance Summary and Written Plan of Care  [name] ,  Thank you for allowing me to perform your Medicare Annual Wellness Visit and for your ongoing commitment to your health.   Health Maintenance & Immunization History Health Maintenance  Topic Date Due   COVID-19 Vaccine (4 - Booster for Janssen series) 07/14/2021 (Originally 08/26/2020)   Zoster Vaccines- Shingrix (1 of 2) 08/02/2021 (Originally 05/02/1952)   TETANUS/TDAP  06/29/2022 (Originally 03/16/2020)   INFLUENZA VACCINE  08/16/2021   Pneumonia Vaccine 91+ Years old  Completed   HPV VACCINES  Aged Out   Immunization History  Administered Date(s) Administered   Fluad Quad(high Dose 65+) 11/02/2020   Influenza,inj,Quad PF,6+ Mos 12/25/2012, 11/03/2014, 11/14/2016   Janssen (J&J) SARS-COV-2 Vaccination 03/28/2019   PFIZER(Purple Top)SARS-COV-2 Vaccination 01/01/2020, 07/01/2020   Pneumococcal Conjugate-13 11/14/2016   Pneumococcal Polysaccharide-23 03/17/2010   Td 03/17/2010   Zoster, Live 12/25/2012    These are the patient goals that we discussed:  Goals Addressed               This Visit's Progress     Patient Stated (pt-stated)        06/28/2021 AWV Goal: Exercise for General Health  Patient will verbalize understanding of the benefits of increased physical activity: Exercising regularly is important. It will improve your overall fitness, flexibility, and endurance. Regular exercise also will improve your overall health. It can help you control your weight, reduce stress, and improve your bone density. Over the next year, patient will increase physical activity as tolerated with a goal of at least 150 minutes of moderate physical activity per week.  You can tell that you are exercising at a moderate intensity if your heart starts beating faster and you start breathing faster but can still hold a conversation. Moderate-intensity exercise ideas include: Walking 1 mile (1.6  km) in about 15 minutes Biking Hiking Golfing Dancing Water aerobics Patient will verbalize understanding of everyday activities that increase physical activity by providing examples like the following: Yard work, such as: Sales promotion account executive Gardening Washing windows or floors Patient will be able to explain general safety guidelines for exercising:  Before you start a new exercise program, talk with your health care provider. Do not exercise so much that you hurt yourself, feel dizzy, or get very short of breath. Wear comfortable clothes and wear shoes with good support. Drink plenty of water while you exercise to prevent dehydration or heat stroke. Work out until your breathing and your heartbeat get faster.          This is a list of Health Maintenance Items that are overdue or due now: Td vaccine Shingrix vaccine    Orders/Referrals Placed Today: No orders of the defined types were placed in this encounter.  (Contact our referral department at [phone] if you have not spoken with someone about your referral appointment within the next 5 days)    Follow-up Plan Follow-up with Donella Stade, PA-C as planned Schedule your tetanus and shingrix vaccine at your pharmacy. Medicare wellness visit in one year.  AVS printed and mailed to the patient.      Health Maintenance, Male Adopting a healthy lifestyle and getting preventive care are important in promoting health and wellness. Ask your health care provider about: The right schedule for you to have regular tests and exams. Things you can do on your own to prevent  diseases and keep yourself healthy. What should I know about diet, weight, and exercise? Eat a healthy diet  Eat a diet that includes plenty of vegetables, fruits, low-fat dairy products, and lean protein. Do not eat a lot of foods that are high in solid fats, added  sugars, or sodium. Maintain a healthy weight Body mass index (BMI) is a measurement that can be used to identify possible weight problems. It estimates body fat based on height and weight. Your health care provider can help determine your BMI and help you achieve or maintain a healthy weight. Get regular exercise Get regular exercise. This is one of the most important things you can do for your health. Most adults should: Exercise for at least 150 minutes each week. The exercise should increase your heart rate and make you sweat (moderate-intensity exercise). Do strengthening exercises at least twice a week. This is in addition to the moderate-intensity exercise. Spend less time sitting. Even light physical activity can be beneficial. Watch cholesterol and blood lipids Have your blood tested for lipids and cholesterol at 86 years of age, then have this test every 5 years. You may need to have your cholesterol levels checked more often if: Your lipid or cholesterol levels are high. You are older than 86 years of age. You are at high risk for heart disease. What should I know about cancer screening? Many types of cancers can be detected early and may often be prevented. Depending on your health history and family history, you may need to have cancer screening at various ages. This may include screening for: Colorectal cancer. Prostate cancer. Skin cancer. Lung cancer. What should I know about heart disease, diabetes, and high blood pressure? Blood pressure and heart disease High blood pressure causes heart disease and increases the risk of stroke. This is more likely to develop in people who have high blood pressure readings or are overweight. Talk with your health care provider about your target blood pressure readings. Have your blood pressure checked: Every 3-5 years if you are 92-69 years of age. Every year if you are 63 years old or older. If you are between the ages of 11 and 93 and  are a current or former smoker, ask your health care provider if you should have a one-time screening for abdominal aortic aneurysm (AAA). Diabetes Have regular diabetes screenings. This checks your fasting blood sugar level. Have the screening done: Once every three years after age 97 if you are at a normal weight and have a low risk for diabetes. More often and at a younger age if you are overweight or have a high risk for diabetes. What should I know about preventing infection? Hepatitis B If you have a higher risk for hepatitis B, you should be screened for this virus. Talk with your health care provider to find out if you are at risk for hepatitis B infection. Hepatitis C Blood testing is recommended for: Everyone born from [birth year] through 1963. Anyone with known risk factors for hepatitis C. Sexually transmitted infections (STIs) You should be screened each year for STIs, including gonorrhea and chlamydia, if: You are sexually active and are younger than 86 years of age. You are older than 86 years of age and your health care provider tells you that you are at risk for this type of infection. Your sexual activity has changed since you were last screened, and you are at increased risk for chlamydia or gonorrhea. Ask your health care provider if you are  at risk. Ask your health care provider about whether you are at high risk for HIV. Your health care provider may recommend a prescription medicine to help prevent HIV infection. If you choose to take medicine to prevent HIV, you should first get tested for HIV. You should then be tested every 3 months for as long as you are taking the medicine. Follow these instructions at home: Alcohol use Do not drink alcohol if your health care provider tells you not to drink. If you drink alcohol: Limit how much you have to 0-2 drinks a day. Know how much alcohol is in your drink. In the [place] one drink equals one 12 oz bottle of beer (355 mL), one 5 oz  glass of wine (148 mL), or one 1 oz glass of hard liquor (44 mL). Lifestyle Do not use any products that contain nicotine or tobacco. These products include cigarettes, chewing tobacco, and vaping devices, such as e-cigarettes. If you need help quitting, ask your health care provider. Do not use street drugs. Do not share needles. Ask your health care provider for help if you need support or information about quitting drugs. General instructions Schedule regular health, dental, and eye exams. Stay current with your vaccines. Tell your health care provider if: You often feel depressed. You have ever been abused or do not feel safe at home. Summary Adopting a healthy lifestyle and getting preventive care are important in promoting health and wellness. Follow your health care provider's instructions about healthy diet, exercising, and getting tested or screened for diseases. Follow your health care provider's instructions on monitoring your cholesterol and blood pressure. This information is not intended to replace advice given to you by your health care provider. Make sure you discuss any questions you have with your health care provider. Document Revised: 05/24/2020 Document Reviewed: 05/24/2020 Elsevier Patient Education  [place].

## 2021-06-28 NOTE — Progress Notes (Signed)
MEDICARE ANNUAL WELLNESS VISIT  06/28/2021  Telephone Visit Disclaimer This Medicare AWV was conducted by telephone due to national recommendations for restrictions regarding the COVID-19 Pandemic (e.g. social distancing).  I verified, using two identifiers, that I am speaking with Gregory Cuevas or their authorized healthcare agent. I discussed the limitations, risks, security, and privacy concerns of performing an evaluation and management service by telephone and the potential availability of an in-person appointment in the future. The patient expressed understanding and agreed to proceed.  Location of Patient: Home Location of Provider (nurse):  In the office.  Subjective:    Gregory Cuevas is a 88 y.o. male patient of Breeback, Royetta Car, PA-C who had a [employer] Visit today via telephone. Gregory Cuevas is Retired and lives with their spouse. he has 2 children. he reports that he is socially active and does interact with friends/family regularly. he is minimally physically active and enjoys yard work and watch television.  Patient Care Team: Lavada Mesi as PCP - General (Family Medicine) Lelon Perla, MD as PCP - Cardiology (Cardiology)     06/28/2021    2:28 PM 09/08/2020    1:04 PM 06/23/2020    1:09 PM 07/14/2013   10:34 AM  Advanced Directives  Does Patient Have a Medical Advance Directive? No No Yes Patient has advance directive, copy not in chart  Type of Advance Directive   Living will Living will  Does patient want to make changes to medical advance directive?   No - Patient declined   Would patient like information on creating a medical advance directive? No - Patient declined No - Patient declined      Hospital Utilization Over the Past 12 Months: # of hospitalizations or ER visits: 0 # of surgeries: 0  Review of Systems    Patient reports that his overall health is unchanged compared to last year.  History obtained from chart review and the  patient  Patient Reported Readings (BP, Pulse, CBG, Weight, etc) none  Pain Assessment Pain : No/denies pain     Current Medications & Allergies (verified) Allergies as of 06/28/2021   No Known Allergies      Medication List        Accurate as of June 28, 2021  2:36 PM. If you have any questions, ask your nurse or doctor.          aspirin EC 81 MG tablet Take 81 mg by mouth daily. Swallow whole.   Fish Oil 1000 MG Caps Take by mouth.   lisinopril 5 MG tablet Commonly known as: ZESTRIL TAKE 1 TABLET(5 MG) BY MOUTH DAILY   MENS MULTIVITAMIN PO Take 1 tablet by mouth daily.   pravastatin 40 MG tablet Commonly known as: PRAVACHOL Take 1 tablet (40 mg total) by mouth daily.   triamcinolone ointment 0.1 % Commonly known as: KENALOG Apply 1 application. topically 2 (two) times daily. To affected areas        History (reviewed): Past Medical History:  Diagnosis Date   AMI (acute myocardial infarction) ([place]) 1980   Aortic regurgitation    Cardiomegaly    Colon cancer ([place]) 1990's   Detached retina    Hyperlipidemia    Hypertension    Past Surgical History:  Procedure Laterality Date   EYE SURGERY     INGUINAL HERNIA REPAIR Right 09/16/2020   Procedure: OPEN REPAIR RECURRENT RIGHT INGUINAL HERNIA WITH MESH;  Surgeon: [name], MD;  Location: [facility];  Service:  General;  Laterality: Right;  TAP BLOCK   PARTIAL COLECTOMY     Family History  Problem Relation Age of Onset   Liver cancer Father        deceased   Cancer Father        liver   Cancer Brother        colon and esophageal   Alzheimer's disease Other        2 sisters   Colon cancer Other        2 brothers   Esophageal cancer Other    Social History   Socioeconomic History   Marital status: Married    Spouse name: Gregory Cuevas   Number of children: 2   Years of education: 6   Highest education level: 6th grade  Occupational History    Comment: Retired  Tobacco Use   Smoking status:  Never   Smokeless tobacco: Never  Vaping Use   Vaping Use: Never used  Substance and Sexual Activity   Alcohol use: Yes    Comment: Rare   Drug use: No   Sexual activity: Not on file  Other Topics Concern   Not on file  Social History Narrative   Lives with wife and son. He enjoys yardwork, watching soccer and news on TV.   Social Determinants of Health   Financial Resource Strain: Low Risk  (06/28/2021)   Overall Financial Resource Strain (CARDIA)    Difficulty of Paying Living Expenses: Not hard at all  Food Insecurity: No Food Insecurity (06/28/2021)   Hunger Vital Sign    Worried About Running Out of Food in the Last Year: Never true    Ran Out of Food in the Last Year: Never true  Transportation Needs: No Transportation Needs (06/28/2021)   PRAPARE - Hydrologist (Medical): No    Lack of Transportation (Non-Medical): No  Physical Activity: Inactive (06/28/2021)   Exercise Vital Sign    Days of Exercise per Week: 0 days    Minutes of Exercise per Session: 0 min  Stress: No Stress Concern Present (06/28/2021)   [place]    Feeling of Stress : Not at all  Social Connections: Moderately Isolated (06/28/2021)   Social Connection and Isolation Panel [NHANES]    Frequency of Communication with Friends and Family: More than three times a week    Frequency of Social Gatherings with Friends and Family: More than three times a week    Attends Religious Services: Never    Marine scientist or Organizations: No    Attends Archivist Meetings: Never    Marital Status: Married    Activities of Daily Living    06/28/2021    2:28 PM 09/08/2020    1:06 PM  In your present state of health, do you have any difficulty performing the following activities:  Hearing? 0   Vision? 0   Difficulty concentrating or making decisions? 1   Comment some memory loss   Walking or climbing  stairs? 0   Dressing or bathing? 0   Doing errands, shopping? 1 0  Comment his son takes him to the appt   Preparing Food and eating ? N   Using the Toilet? N   In the past six months, have you accidently leaked urine? N   Do you have problems with loss of bowel control? N   Managing your Medications? N   Managing your Finances? N  Housekeeping or managing your Housekeeping? N     Patient Education/ Literacy How often do you need to have someone help you when you read instructions, pamphlets, or other written materials from your doctor or pharmacy?: 1 - Never What is the last grade level you completed in school?: 6th grade  Exercise Current Exercise Habits: The patient does not participate in regular exercise at present, Exercise limited by: None identified  Diet Patient reports consuming 2 meals a day and 1 snack(s) a day Patient reports that his primary diet is: Regular Patient reports that she does have regular access to food.   Depression Screen    06/28/2021    2:30 PM 05/03/2021   10:56 AM 06/23/2020    1:09 PM 04/26/2020   10:40 AM 04/10/2017    9:29 AM 07/07/2016   10:32 AM 04/16/2013   10:33 AM  PHQ 2/9 Scores  PHQ - 2 Score 0 '2 1 1 '$ 0 0 0  PHQ- 9 Score 0 '8 1 3 4       '$ Fall Risk    06/28/2021    2:30 PM 05/03/2021   10:31 AM 06/23/2020    1:09 PM 04/26/2020   10:40 AM 08/20/2019    8:57 AM  Fall Risk   Falls in the past year? 0 0 0 0 0  Number falls in past yr: 0 0 0 0   Injury with Fall? 0 0 0 0   Risk for fall due to : No Fall Risks No Fall Risks No Fall Risks  No Fall Risks  Follow up Falls evaluation completed Falls evaluation completed Falls evaluation completed Falls evaluation completed Falls evaluation completed     Objective:  Benney [place] seemed alert and oriented and he participated appropriately during our telephone visit.  Blood Pressure Weight BMI  BP Readings from Last 3 Encounters:  05/03/21 (!) 141/52  02/14/21 (!) 141/69  11/02/20 (!) 138/54   Wt  Readings from Last 3 Encounters:  05/03/21 126 lb (57.2 kg)  02/14/21 127 lb (57.6 kg)  11/02/20 124 lb 0.6 oz (56.3 kg)   BMI Readings from Last 1 Encounters:  05/03/21 21.63 kg/m    *Unable to obtain current vital signs, weight, and BMI due to telephone visit type  Hearing/Vision  Zayvien did not seem to have difficulty with hearing/understanding during the telephone conversation Reports that he has not had a formal eye exam by an eye care professional within the past year Reports that he has not had a formal hearing evaluation within the past year *Unable to fully assess hearing and vision during telephone visit type  Cognitive Function:    06/28/2021    2:31 PM 06/23/2020    1:16 PM 11/14/2016   11:52 AM  6CIT Screen  What Year? 0 points 0 points 0 points  What month? 0 points 0 points 0 points  What time? 0 points 0 points 0 points  Count back from 20 0 points 2 points 0 points  Months in reverse 4 points 4 points 2 points  Repeat phrase 10 points 10 points 2 points  Total Score 14 points 16 points 4 points   (Normal:0-7, Significant for Dysfunction: >8)  Normal Cognitive Function Screening: No: he expresses some memory loss.   Immunization & Health Maintenance Record Immunization History  Administered Date(s) Administered   Fluad Quad(high Dose 65+) 11/02/2020   Influenza,inj,Quad PF,6+ Mos 12/25/2012, 11/03/2014, 11/14/2016   Janssen (J&J) SARS-COV-2 Vaccination 03/28/2019   PFIZER(Purple Top)SARS-COV-2 Vaccination 01/01/2020, 07/01/2020  Pneumococcal Conjugate-13 11/14/2016   Pneumococcal Polysaccharide-23 03/17/2010   Td 03/17/2010   Zoster, Live 12/25/2012    Health Maintenance  Topic Date Due   COVID-19 Vaccine (4 - Booster for Janssen series) 07/14/2021 (Originally 08/26/2020)   Zoster Vaccines- Shingrix (1 of 2) 08/02/2021 (Originally 05/02/1952)   TETANUS/TDAP  06/29/2022 (Originally 03/16/2020)   INFLUENZA VACCINE  08/16/2021   Pneumonia Vaccine 65+ Years  old  Completed   HPV VACCINES  Aged Out       Assessment  This is a routine wellness examination for [employer].  Health Maintenance: Due or Overdue There are no preventive care reminders to display for this patient.   Gregory Cuevas does not need a referral for Community Assistance: Care Management:   no Social Work:    no Prescription Assistance:  no Nutrition/Diabetes Education:  no   Plan:  Personalized Goals  Goals Addressed               This Visit's Progress     Patient Stated (pt-stated)        06/28/2021 AWV Goal: Exercise for General Health  Patient will verbalize understanding of the benefits of increased physical activity: Exercising regularly is important. It will improve your overall fitness, flexibility, and endurance. Regular exercise also will improve your overall health. It can help you control your weight, reduce stress, and improve your bone density. Over the next year, patient will increase physical activity as tolerated with a goal of at least 150 minutes of moderate physical activity per week.  You can tell that you are exercising at a moderate intensity if your heart starts beating faster and you start breathing faster but can still hold a conversation. Moderate-intensity exercise ideas include: Walking 1 mile (1.6 km) in about 15 minutes Biking Hiking Golfing Dancing Water aerobics Patient will verbalize understanding of everyday activities that increase physical activity by providing examples like the following: Yard work, such as: Sales promotion account executive Gardening Washing windows or floors Patient will be able to explain general safety guidelines for exercising:  Before you start a new exercise program, talk with your health care provider. Do not exercise so much that you hurt yourself, feel dizzy, or get very short of breath. Wear comfortable clothes and wear shoes with  good support. Drink plenty of water while you exercise to prevent dehydration or heat stroke. Work out until your breathing and your heartbeat get faster.        Personalized Health Maintenance & Screening Recommendations  Td vaccine Shingrix vaccine  Lung Cancer Screening Recommended: no (Low Dose CT Chest recommended if Age 78-80 years, 30 pack-year currently smoking OR have quit w/in past 15 years) Hepatitis C Screening recommended: no HIV Screening recommended: no  Advanced Directives: Written information was not prepared per patient's request.  Referrals & Orders No orders of the defined types were placed in this encounter.   Follow-up Plan Follow-up with Gregory Stade, PA-C as planned Schedule your tetanus and shingrix vaccine at your pharmacy. Medicare wellness visit in one year.  AVS printed and mailed to the patient.   I have personally reviewed and noted the following in the patient's chart:   Medical and social history Use of alcohol, tobacco or illicit drugs  Current medications and supplements Functional ability and status Nutritional status Physical activity Advanced directives List of other physicians Hospitalizations, surgeries, and ER visits in previous 12 months Vitals Screenings  to include cognitive, depression, and falls Referrals and appointments  In addition, I have reviewed and discussed with Gregory Cuevas certain preventive protocols, quality metrics, and best practice recommendations. A written personalized care plan for preventive services as well as general preventive health recommendations is available and can be mailed to the patient at his request.      Tinnie Gens, RN-BSN  06/28/2021

## 2021-08-09 ENCOUNTER — Other Ambulatory Visit: Payer: Self-pay | Admitting: Neurology

## 2021-08-09 DIAGNOSIS — I351 Nonrheumatic aortic (valve) insufficiency: Secondary | ICD-10-CM

## 2021-08-09 DIAGNOSIS — E7800 Pure hypercholesterolemia, unspecified: Secondary | ICD-10-CM

## 2021-08-09 DIAGNOSIS — I517 Cardiomegaly: Secondary | ICD-10-CM

## 2021-08-09 DIAGNOSIS — I10 Essential (primary) hypertension: Secondary | ICD-10-CM

## 2021-11-02 ENCOUNTER — Encounter: Payer: Self-pay | Admitting: Physician Assistant

## 2021-11-02 ENCOUNTER — Ambulatory Visit (INDEPENDENT_AMBULATORY_CARE_PROVIDER_SITE_OTHER): Payer: Medicare Other | Admitting: Physician Assistant

## 2021-11-02 VITALS — BP 142/66 | HR 55 | Ht 64.0 in | Wt 124.0 lb

## 2021-11-02 DIAGNOSIS — I10 Essential (primary) hypertension: Secondary | ICD-10-CM | POA: Diagnosis not present

## 2021-11-02 DIAGNOSIS — I351 Nonrheumatic aortic (valve) insufficiency: Secondary | ICD-10-CM

## 2021-11-02 DIAGNOSIS — I517 Cardiomegaly: Secondary | ICD-10-CM

## 2021-11-02 DIAGNOSIS — Z23 Encounter for immunization: Secondary | ICD-10-CM

## 2021-11-02 DIAGNOSIS — E7800 Pure hypercholesterolemia, unspecified: Secondary | ICD-10-CM

## 2021-11-02 MED ORDER — LISINOPRIL 5 MG PO TABS: ORAL_TABLET | ORAL | 3 refills | Status: DC

## 2021-11-02 MED ORDER — PRAVASTATIN SODIUM 40 MG PO TABS: 40.0000 mg | ORAL_TABLET | Freq: Every day | ORAL | 3 refills | Status: DC

## 2021-11-02 NOTE — Progress Notes (Signed)
Established Patient Office Visit  Subjective   Patient ID: Gregory Cuevas, male    DOB: January 10, 1934  Age: 88 y.o. MRN: [id number]  Chief Complaint  Patient presents with   Follow-up    HPI Pt is a 88 yo male with HTN, AR, HLD who presents to the clinic for 6 month follow up. Pt is accompanied by son. No problems to report. He is doing well. No SOB, CP, palpitations, headaches. Energy is good.   .. Active Ambulatory Problems    Diagnosis Date Noted   Adenomatous polyp of colon 02/10/2010   Hyperlipidemia 02/11/2010   Cardiomegaly 43/32/9518   Diastolic dysfunction 84/16/6063   Mild aortic regurgitation 03/02/2014   ASD (atrial septal defect) 03/02/2014   Aortic insufficiency 03/02/2014   Essential hypertension, benign 11/03/2014   Acute depression 03/01/2015   Acute anxiety 03/01/2015   Constipation 03/18/2015   Nocturia 03/18/2015   Acute prostatitis 03/18/2015   Hyponatremia 06/22/2016   Fracture of great toe, right, closed 07/25/2016   Blind left eye 11/14/2016   Vision changes 11/14/2016   Chronic venous insufficiency 04/10/2017   Bilateral lower extremity edema 04/10/2017   Dizziness 04/10/2017   History of retinal detachment 04/10/2017   Benign prostatic hyperplasia without lower urinary tract symptoms 04/10/2017   Weakness 08/20/2019   Inguinal hernia, right 08/03/2020   Bilateral hearing loss due to cerumen impaction 02/14/2021   Resolved Ambulatory Problems    Diagnosis Date Noted   Essential hypertension, benign 02/10/2010   DARK URINE 02/10/2010   Rhinorrhea 01/26/2014   Nasal congestion 01/26/2014   Adenomatous polyp of colon 08/04/2015   Past Medical History:  Diagnosis Date   AMI (acute myocardial infarction) ([facility]) 1980   Aortic regurgitation    Colon cancer ([place]) 1990's   Detached retina    Hypertension        Review of Systems  All other systems reviewed and are negative.     Objective:     BP (!) 142/66   Pulse (!) 55   Ht '5\' 4"'$  (1.626  m)   Wt 124 lb (56.2 kg)   SpO2 100%   BMI 21.28 kg/m  BP Readings from Last 3 Encounters:  11/02/21 (!) 142/66  05/03/21 (!) 141/52  02/14/21 (!) 141/69   Wt Readings from Last 3 Encounters:  11/02/21 124 lb (56.2 kg)  05/03/21 126 lb (57.2 kg)  02/14/21 127 lb (57.6 kg)      Physical Exam Vitals reviewed.  Constitutional:      Appearance: Normal appearance.  HENT:     Head: Normocephalic.  Eyes:     Conjunctiva/sclera: Conjunctivae normal.     Pupils: Pupils are equal, round, and reactive to light.  Neck:     Vascular: No carotid bruit.  Cardiovascular:     Rate and Rhythm: Normal rate and regular rhythm.     Pulses: Normal pulses.     Heart sounds: Murmur heard.  Pulmonary:     Effort: Pulmonary effort is normal.     Breath sounds: Normal breath sounds.  Musculoskeletal:     Cervical back: Normal range of motion and neck supple.     Right lower leg: No edema.     Left lower leg: No edema.  Lymphadenopathy:     Cervical: No cervical adenopathy.  Neurological:     General: No focal deficit present.     Mental Status: He is alert and oriented to person, place, and time.  Psychiatric:  Mood and Affect: Mood normal.        Assessment & Plan:   Marland KitchenMarland KitchenPericles was seen today for follow-up.  Diagnoses and all orders for this visit:  Essential hypertension, benign -     COMPLETE METABOLIC PANEL WITH GFR -     lisinopril (ZESTRIL) 5 MG tablet; TAKE 1 TABLET(5 MG) BY MOUTH DAILY  Flu vaccine need -     Flu Vaccine QUAD 18moIM (Fluarix, Fluzone & Alfiuria Quad PF)  Need for COVID-19 vaccine -[employer] [place] TravelersFall 2023 Covid-19 Vaccine 120yrand older  Pure hypercholesterolemia -     pravastatin (PRAVACHOL) 40 MG tablet; Take 1 tablet (40 mg total) by mouth daily.  Cardiomegaly -     lisinopril (ZESTRIL) 5 MG tablet; TAKE 1 TABLET(5 MG) BY MOUTH DAILY  Nonrheumatic aortic valve insufficiency -     lisinopril (ZESTRIL) 5 MG tablet; TAKE 1 TABLET(5 MG) BY MOUTH  DAILY  Mild aortic regurgitation -     lisinopril (ZESTRIL) 5 MG tablet; TAKE 1 TABLET(5 MG) BY MOUTH DAILY   Pt is doing great Cmp ordered Medications refilled. Flu and covid vaccine given without complication.   Return in about 6 months (around 05/04/2022).    JaIran PlanasPA-C

## 2021-11-03 LAB — COMPLETE METABOLIC PANEL WITH GFR
AG Ratio: 2 (calc) (ref 1.0–2.5)
ALT: 10 U/L (ref 9–46)
AST: 17 U/L (ref 10–35)
Albumin: 4.1 g/dL (ref 3.6–5.1)
Alkaline phosphatase (APISO): 59 U/L (ref 35–144)
BUN: 16 mg/dL (ref 7–25)
CO2: 29 mmol/L (ref 20–32)
Calcium: 9.3 mg/dL (ref 8.6–10.3)
Chloride: 101 mmol/L (ref 98–110)
Creat: 1.06 mg/dL (ref 0.70–1.22)
Globulin: 2.1 g/dL (calc) (ref 1.9–3.7)
Glucose, Bld: 83 mg/dL (ref 65–99)
Potassium: 4.8 mmol/L (ref 3.5–5.3)
Sodium: 136 mmol/L (ref 135–146)
Total Bilirubin: 1.1 mg/dL (ref 0.2–1.2)
Total Protein: 6.2 g/dL (ref 6.1–8.1)
eGFR: 68 mL/min/{1.73_m2} (ref >=60)

## 2021-11-04 NOTE — Progress Notes (Signed)
Labs look great.

## 2022-05-05 ENCOUNTER — Encounter: Payer: Self-pay | Admitting: Physician Assistant

## 2022-05-05 ENCOUNTER — Ambulatory Visit (INDEPENDENT_AMBULATORY_CARE_PROVIDER_SITE_OTHER): Payer: Medicare Other | Admitting: Physician Assistant

## 2022-05-05 VITALS — BP 138/60 | HR 56 | Ht 64.0 in | Wt 122.5 lb

## 2022-05-05 DIAGNOSIS — D692 Other nonthrombocytopenic purpura: Secondary | ICD-10-CM | POA: Insufficient documentation

## 2022-05-05 DIAGNOSIS — R233 Spontaneous ecchymoses: Secondary | ICD-10-CM

## 2022-05-05 NOTE — Progress Notes (Signed)
   Acute Office Visit  Subjective:     Patient ID: Gregory Cuevas, male    DOB: 02/03/1933, 89 y.o.   MRN: [id number]  No chief complaint on file.   HPI Patient is in today for  ROS      Objective:    There were no vitals taken for this visit. BP Readings from Last 3 Encounters:  05/05/22 138/60  11/02/21 (!) 142/66  05/03/21 (!) 141/52   Wt Readings from Last 3 Encounters:  05/05/22 122 lb 8 oz (55.6 kg)  11/02/21 124 lb (56.2 kg)  05/03/21 126 lb (57.2 kg)      Physical Exam  No results found for any visits on 05/05/22.      Assessment & Plan:    No follow-ups on file.  Tandy Gaw, PA-C

## 2022-05-06 LAB — CBC WITH DIFFERENTIAL/PLATELET
Absolute Monocytes: 525 cells/uL (ref 200–950)
Basophils Absolute: 31 cells/uL (ref 0–200)
Basophils Relative: 0.6 %
Eosinophils Absolute: 182 cells/uL (ref 15–500)
Eosinophils Relative: 3.5 %
HCT: 40.8 % (ref 38.5–50.0)
Hemoglobin: 13.4 g/dL (ref 13.2–17.1)
Lymphs Abs: 2044 cells/uL (ref 850–3900)
MCH: 29.5 pg (ref 27.0–33.0)
MCHC: 32.8 g/dL (ref 32.0–36.0)
MCV: 89.9 fL (ref 80.0–100.0)
MPV: 10.3 fL (ref 7.5–12.5)
Monocytes Relative: 10.1 %
Neutro Abs: 2418 cells/uL (ref 1500–7800)
Neutrophils Relative %: 46.5 %
Platelets: 205 10*3/uL (ref 140–400)
RBC: 4.54 10*6/uL (ref 4.20–5.80)
RDW: 12.4 % (ref 11.0–15.0)
Total Lymphocyte: 39.3 %
WBC: 5.2 10*3/uL (ref 3.8–10.8)

## 2022-05-06 LAB — COMPLETE METABOLIC PANEL WITH GFR
AG Ratio: 2.2 (calc) (ref 1.0–2.5)
ALT: 13 U/L (ref 9–46)
AST: 18 U/L (ref 10–35)
Albumin: 4.1 g/dL (ref 3.6–5.1)
Alkaline phosphatase (APISO): 58 U/L (ref 35–144)
BUN/Creatinine Ratio: 24 (calc) — ABNORMAL HIGH (ref 6–22)
BUN: 27 mg/dL — ABNORMAL HIGH (ref 7–25)
CO2: 27 mmol/L (ref 20–32)
Calcium: 9.4 mg/dL (ref 8.6–10.3)
Chloride: 106 mmol/L (ref 98–110)
Creat: 1.11 mg/dL (ref 0.70–1.22)
Globulin: 1.9 g/dL (calc) (ref 1.9–3.7)
Glucose, Bld: 87 mg/dL (ref 65–99)
Potassium: 4.7 mmol/L (ref 3.5–5.3)
Sodium: 140 mmol/L (ref 135–146)
Total Bilirubin: 0.8 mg/dL (ref 0.2–1.2)
Total Protein: 6 g/dL — ABNORMAL LOW (ref 6.1–8.1)
eGFR: 64 mL/min/{1.73_m2} (ref >=60)

## 2022-05-08 NOTE — Progress Notes (Signed)
Steele,   Platelets on the low side of normal but pretty stable from past checks.  You could go off the ASA  and see if it makes any difference in bruising.   Your kidneys look dry. Make sure you are drinking more water.   Your protein in blood is low. Increase protein in diet.   Recheck labs in 3 months.

## 2022-07-04 ENCOUNTER — Ambulatory Visit (INDEPENDENT_AMBULATORY_CARE_PROVIDER_SITE_OTHER): Payer: Medicare Other | Admitting: Physician Assistant

## 2022-07-04 DIAGNOSIS — Z0000 Encounter for general adult medical examination without abnormal findings: Secondary | ICD-10-CM | POA: Diagnosis not present

## 2022-07-04 NOTE — Progress Notes (Signed)
MEDICARE ANNUAL WELLNESS VISIT  07/04/2022  Telephone Visit Disclaimer This Medicare AWV was conducted by telephone due to national recommendations for restrictions regarding the COVID-19 Pandemic (e.g. social distancing).  I verified, using two identifiers, that I am speaking with Jacklynn Ganong or their authorized healthcare agent. I discussed the limitations, risks, security, and privacy concerns of performing an evaluation and management service by telephone and the potential availability of an in-person appointment in the future. The patient expressed understanding and agreed to proceed.  Location of Patient: Home with wife, Doreene Eland Location of Provider (nurse):  In the office.  Subjective:    Boden Valleau is a 89 y.o. male patient of Breeback, Lonna Cobb, PA-C who had a [employer] Visit today via telephone. Bezaleel is Retired and lives with their family. he has 2 children. he reports that he is socially active and does interact with friends/family regularly. he is minimally physically active and enjoys watching soccer and news on TV and yardwork.  Patient Care Team: Nolene Ebbs as PCP - General (Family Medicine) Lewayne Bunting, MD as PCP - Cardiology (Cardiology)     07/04/2022   11:16 AM 06/28/2021    2:28 PM 09/08/2020    1:04 PM 06/23/2020    1:09 PM 07/14/2013   10:34 AM  Advanced Directives  Does Patient Have a Medical Advance Directive? No No No Yes Patient has advance directive, copy not in chart  Type of Advance Directive    Living will Living will  Does patient want to make changes to medical advance directive?    No - Patient declined   Would patient like information on creating a medical advance directive? No - Patient declined No - Patient declined No - Patient declined      Hospital Utilization Over the Past 12 Months: # of hospitalizations or ER visits: 0 # of surgeries: 0  Review of Systems    Patient reports that his overall health is unchanged  compared to last year.  History obtained from chart review, the patient, and his wife, Jaleal Vandeberg  Patient Reported Readings (BP, Pulse, CBG, Weight, etc) none  Pain Assessment Pain : No/denies pain     Current Medications & Allergies (verified) Allergies as of 07/04/2022   No Known Allergies      Medication List        Accurate as of July 04, 2022 11:26 AM. If you have any questions, ask your nurse or doctor.          aspirin EC 81 MG tablet Take 81 mg by mouth daily. Swallow whole.   Fish Oil 1000 MG Caps Take by mouth.   lisinopril 5 MG tablet Commonly known as: ZESTRIL TAKE 1 TABLET(5 MG) BY MOUTH DAILY   MENS MULTIVITAMIN PO Take 1 tablet by mouth daily.   pravastatin 40 MG tablet Commonly known as: PRAVACHOL Take 1 tablet (40 mg total) by mouth daily.        History (reviewed): Past Medical History:  Diagnosis Date   AMI (acute myocardial infarction) ([facility]) 1980   Aortic regurgitation    Cardiomegaly    Colon cancer ([facility]) 1990's   Detached retina    Hyperlipidemia    Hypertension    Past Surgical History:  Procedure Laterality Date   EYE SURGERY     INGUINAL HERNIA REPAIR Right 09/16/2020   Procedure: OPEN REPAIR RECURRENT RIGHT INGUINAL HERNIA WITH MESH;  Surgeon: Gaynelle Adu, MD;  Location: [facility];  Service: General;  Laterality: Right;  TAP BLOCK   PARTIAL COLECTOMY     Family History  Problem Relation Age of Onset   Liver cancer Father        deceased   Cancer Father        liver   Cancer Brother        colon and esophageal   Alzheimer's disease Other        2 sisters   Colon cancer Other        2 brothers   Esophageal cancer Other    Social History   Socioeconomic History   Marital status: Married    Spouse name: Byrd Hesselbach   Number of children: 2   Years of education: 6   Highest education level: 6th grade  Occupational History    Comment: Retired  Tobacco Use   Smoking status: Never   Smokeless tobacco: Never  Vaping Use    Vaping Use: Never used  Substance and Sexual Activity   Alcohol use: Yes    Comment: Rare   Drug use: No   Sexual activity: Not on file  Other Topics Concern   Not on file  Social History Narrative   Lives with wife and son. He enjoys yardwork, watching soccer and news on TV.   Social Determinants of Health   Financial Resource Strain: Low Risk  (07/04/2022)   Overall Financial Resource Strain (CARDIA)    Difficulty of Paying Living Expenses: Not hard at all  Food Insecurity: No Food Insecurity (07/04/2022)   Hunger Vital Sign    Worried About Running Out of Food in the Last Year: Never true    Ran Out of Food in the Last Year: Never true  Transportation Needs: No Transportation Needs (07/04/2022)   PRAPARE - Administrator, Civil Service (Medical): No    Lack of Transportation (Non-Medical): No  Physical Activity: Inactive (07/04/2022)   Exercise Vital Sign    Days of Exercise per Week: 0 days    Minutes of Exercise per Session: 0 min  Stress: No Stress Concern Present (07/04/2022)   [employer] of Occupational Health - Occupational Stress Questionnaire    Feeling of Stress : Only a little  Social Connections: Moderately Isolated (07/04/2022)   Social Connection and Isolation Panel [NHANES]    Frequency of Communication with Friends and Family: More than three times a week    Frequency of Social Gatherings with Friends and Family: More than three times a week    Attends Religious Services: Never    Database administrator or Organizations: No    Attends Banker Meetings: Never    Marital Status: Married    Activities of Daily Living    07/04/2022   11:18 AM  In your present state of health, do you have any difficulty performing the following activities:  Hearing? 0  Vision? 0  Difficulty concentrating or making decisions? 1  Comment some memory loss  Walking or climbing stairs? 0  Dressing or bathing? 0  Doing errands, shopping? 0   Preparing Food and eating ? N  Using the Toilet? N  In the past six months, have you accidently leaked urine? N  Do you have problems with loss of bowel control? N  Managing your Medications? N  Managing your Finances? N  Housekeeping or managing your Housekeeping? N    Patient Education/ Literacy How often do you need to have someone help you when you read instructions, pamphlets,  or other written materials from your doctor or pharmacy?: 3 - Sometimes What is the last grade level you completed in school?: 6th grade  Exercise    Diet Patient reports consuming 1 meals a day and 3 snack(s) a day Patient reports that his primary diet is: Regular Patient reports that she does have regular access to food.   Depression Screen    07/04/2022   11:17 AM 05/05/2022    1:54 PM 11/02/2021   11:27 AM 06/28/2021    2:30 PM 05/03/2021   10:56 AM 06/23/2020    1:09 PM 04/26/2020   10:40 AM  PHQ 2/9 Scores  PHQ - 2 Score 1 0 2 0 2 1 1   PHQ- 9 Score   7 0 8 1 3      Fall Risk    07/04/2022   11:16 AM 05/05/2022    1:53 PM 11/02/2021   11:27 AM 06/28/2021    2:30 PM 05/03/2021   10:31 AM  Fall Risk   Falls in the past year? 0 0 0 0 0  Number falls in past yr: 0 0 0 0 0  Injury with Fall? 0 0 0 0 0  Risk for fall due to : No Fall Risks No Fall Risks No Fall Risks No Fall Risks No Fall Risks  Follow up Falls evaluation completed Falls evaluation completed Falls evaluation completed Falls evaluation completed Falls evaluation completed     Objective:  Spero Wehe seemed alert and oriented and he participated appropriately during our telephone visit.  Blood Pressure Weight BMI  BP Readings from Last 3 Encounters:  05/05/22 138/60  11/02/21 (!) 142/66  05/03/21 (!) 141/52   Wt Readings from Last 3 Encounters:  05/05/22 122 lb 8 oz (55.6 kg)  11/02/21 124 lb (56.2 kg)  05/03/21 126 lb (57.2 kg)   BMI Readings from Last 1 Encounters:  05/05/22 21.03 kg/m    *Unable to obtain current vital  signs, weight, and BMI due to telephone visit type  Hearing/Vision  Sabien did not seem to have difficulty with hearing/understanding during the telephone conversation Reports that he has had a formal eye exam by an eye care professional within the past year Reports that he has not had a formal hearing evaluation within the past year *Unable to fully assess hearing and vision during telephone visit type  Cognitive Function:    07/04/2022   11:19 AM 06/28/2021    2:31 PM 06/23/2020    1:16 PM 11/14/2016   11:52 AM  6CIT Screen  What Year? 0 points 0 points 0 points 0 points  What month? 0 points 0 points 0 points 0 points  What time? 0 points 0 points 0 points 0 points  Count back from 20 0 points 0 points 2 points 0 points  Months in reverse 4 points 4 points 4 points 2 points  Repeat phrase 6 points 10 points 10 points 2 points  Total Score 10 points 14 points 16 points 4 points   (Normal:0-7, Significant for Dysfunction: >8)  Normal Cognitive Function Screening: No: improved from previous year. PCP notified.   Immunization & Health Maintenance Record Immunization History  Administered Date(s) Administered   COVID-19, mRNA, vaccine(Comirnaty)12 years and older 11/02/2021   Fluad Quad(high Dose 65+) 11/02/2020   Influenza,inj,Quad PF,6+ Mos 12/25/2012, 11/03/2014, 11/14/2016, 11/02/2021   Janssen (J&J) SARS-COV-2 Vaccination 03/28/2019   PFIZER(Purple Top)SARS-COV-2 Vaccination 01/01/2020, 07/01/2020   Pneumococcal Conjugate-13 11/14/2016   Pneumococcal Polysaccharide-23 03/17/2010   Td 03/17/2010  Zoster, Live 12/25/2012    Health Maintenance  Topic Date Due   DTaP/Tdap/Td (2 - Tdap) 03/16/2020   COVID-19 Vaccine (5 - 2023-24 season) 10/21/2022 (Originally 12/28/2021)   Zoster Vaccines- Shingrix (1 of 2) 11/04/2022 (Originally 05/02/1952)   INFLUENZA VACCINE  08/17/2022   Medicare Annual Wellness (AWV)  07/04/2023   Pneumonia Vaccine 40+ Years old  Completed   HPV  VACCINES  Aged Out       Assessment  This is a routine wellness examination for [employer].  Health Maintenance: Due or Overdue Health Maintenance Due  Topic Date Due   DTaP/Tdap/Td (2 - Tdap) 03/16/2020    Jacklynn Ganong does not need a referral for Community Assistance: Care Management:   no Social Work:    no Prescription Assistance:  no Nutrition/Diabetes Education:  no   Plan:  Personalized Goals  Goals Addressed               This Visit's Progress     Patient Stated (pt-stated)        07/04/2022 AWV Goal: Exercise for General Health  Patient will verbalize understanding of the benefits of increased physical activity: Exercising regularly is important. It will improve your overall fitness, flexibility, and endurance. Regular exercise also will improve your overall health. It can help you control your weight, reduce stress, and improve your bone density. Over the next year, patient will increase physical activity as tolerated with a goal of at least 150 minutes of moderate physical activity per week.  You can tell that you are exercising at a moderate intensity if your heart starts beating faster and you start breathing faster but can still hold a conversation. Moderate-intensity exercise ideas include: Walking 1 mile (1.6 km) in about 15 minutes Biking Hiking Golfing Dancing Water aerobics Patient will verbalize understanding of everyday activities that increase physical activity by providing examples like the following: Yard work, such as: Insurance underwriter Gardening Washing windows or floors Patient will be able to explain general safety guidelines for exercising:  Before you start a new exercise program, talk with your health care provider. Do not exercise so much that you hurt yourself, feel dizzy, or get very short of breath. Wear comfortable clothes and wear shoes with good  support. Drink plenty of water while you exercise to prevent dehydration or heat stroke. Work out until your breathing and your heartbeat get faster.        Personalized Health Maintenance & Screening Recommendations  Td vaccine Shingles vaccine  Lung Cancer Screening Recommended: no (Low Dose CT Chest recommended if Age 62-80 years, 20 pack-year currently smoking OR have quit w/in past 15 years) Hepatitis C Screening recommended: no HIV Screening recommended: no  Advanced Directives: Written information was not prepared per patient's request.  Referrals & Orders No orders of the defined types were placed in this encounter.   Follow-up Plan Follow-up with Jomarie Longs, PA-C as planned Schedule tetanus shot and shingles vaccine at the pharmacy. Medicare wellness visit in one year.  AVS printed and mailed to the patient.     I have personally reviewed and noted the following in the patient's chart:   Medical and social history Use of alcohol, tobacco or illicit drugs  Current medications and supplements Functional ability and status Nutritional status Physical activity Advanced directives List of other physicians Hospitalizations, surgeries, and ER visits in previous 12 months Vitals Screenings to include  cognitive, depression, and falls Referrals and appointments  In addition, I have reviewed and discussed with Jacklynn Ganong certain preventive protocols, quality metrics, and best practice recommendations. A written personalized care plan for preventive services as well as general preventive health recommendations is available and can be mailed to the patient at his request.      Modesto Charon, RN BSN  07/04/2022

## 2022-07-04 NOTE — Patient Instructions (Addendum)
MEDICARE ANNUAL WELLNESS VISIT Health Maintenance Summary and Written Plan of Care  Gregory Cuevas ,  Thank you for allowing me to perform your Medicare Annual Wellness Visit and for your ongoing commitment to your health.   Health Maintenance & Immunization History Health Maintenance  Topic Date Due   DTaP/Tdap/Td (2 - Tdap) 03/16/2020   COVID-19 Vaccine (5 - 2023-24 season) 10/21/2022 (Originally 12/28/2021)   Zoster Vaccines- Shingrix (1 of 2) 11/04/2022 (Originally 05/02/1952)   INFLUENZA VACCINE  08/17/2022   Medicare Annual Wellness (AWV)  07/04/2023   Pneumonia Vaccine 34+ Years old  Completed   HPV VACCINES  Aged Out   Immunization History  Administered Date(s) Administered   COVID-19, mRNA, vaccine(Comirnaty)12 years and older 11/02/2021   Fluad Quad(high Dose 65+) 11/02/2020   Influenza,inj,Quad PF,6+ Mos 12/25/2012, 11/03/2014, 11/14/2016, 11/02/2021   Janssen (J&J) SARS-COV-2 Vaccination 03/28/2019   PFIZER(Purple Top)SARS-COV-2 Vaccination 01/01/2020, 07/01/2020   Pneumococcal Conjugate-13 11/14/2016   Pneumococcal Polysaccharide-23 03/17/2010   Td 03/17/2010   Zoster, Live 12/25/2012    These are the patient goals that we discussed:  Goals Addressed               This Visit's Progress     Patient Stated (pt-stated)        07/04/2022 AWV Goal: Exercise for General Health  Patient will verbalize understanding of the benefits of increased physical activity: Exercising regularly is important. It will improve your overall fitness, flexibility, and endurance. Regular exercise also will improve your overall health. It can help you control your weight, reduce stress, and improve your bone density. Over the next year, patient will increase physical activity as tolerated with a goal of at least 150 minutes of moderate physical activity per week.  You can tell that you are exercising at a moderate intensity if your heart starts beating faster and you start breathing faster  but can still hold a conversation. Moderate-intensity exercise ideas include: Walking 1 mile (1.6 km) in about 15 minutes Biking Hiking Golfing Dancing Water aerobics Patient will verbalize understanding of everyday activities that increase physical activity by providing examples like the following: Yard work, such as: Insurance underwriter Gardening Washing windows or floors Patient will be able to explain general safety guidelines for exercising:  Before you start a new exercise program, talk with your health care provider. Do not exercise so much that you hurt yourself, feel dizzy, or get very short of breath. Wear comfortable clothes and wear shoes with good support. Drink plenty of water while you exercise to prevent dehydration or heat stroke. Work out until your breathing and your heartbeat get faster.          This is a list of Health Maintenance Items that are overdue or due now: Health Maintenance Due  Topic Date Due   DTaP/Tdap/Td (2 - Tdap) 03/16/2020  Td vaccine Shingles vaccine   Orders/Referrals Placed Today: No orders of the defined types were placed in this encounter.  (Contact our referral department at [phone] if you have not spoken with someone about your referral appointment within the next 5 days)    Follow-up Plan Follow-up with Jomarie Longs, PA-C as planned Schedule tetanus shot and shingles vaccine at the pharmacy. Medicare wellness visit in one year.  AVS printed and mailed to the patient.       Health Maintenance, Male Adopting a healthy lifestyle and getting preventive care are important  in promoting health and wellness. Ask your health care provider about: The right schedule for you to have regular tests and exams. Things you can do on your own to prevent diseases and keep yourself healthy. What should I know about diet, weight, and exercise? Eat a  healthy diet  Eat a diet that includes plenty of vegetables, fruits, low-fat dairy products, and lean protein. Do not eat a lot of foods that are high in solid fats, added sugars, or sodium. Maintain a healthy weight Body mass index (BMI) is a measurement that can be used to identify possible weight problems. It estimates body fat based on height and weight. Your health care provider can help determine your BMI and help you achieve or maintain a healthy weight. Get regular exercise Get regular exercise. This is one of the most important things you can do for your health. Most adults should: Exercise for at least 150 minutes each week. The exercise should increase your heart rate and make you sweat (moderate-intensity exercise). Do strengthening exercises at least twice a week. This is in addition to the moderate-intensity exercise. Spend less time sitting. Even light physical activity can be beneficial. Watch cholesterol and blood lipids Have your blood tested for lipids and cholesterol at 87 years of age, then have this test every 5 years. You may need to have your cholesterol levels checked more often if: Your lipid or cholesterol levels are high. You are older than 87 years of age. You are at high risk for heart disease. What should I know about cancer screening? Many types of cancers can be detected early and may often be prevented. Depending on your health history and family history, you may need to have cancer screening at various ages. This may include screening for: Colorectal cancer. Prostate cancer. Skin cancer. Lung cancer. What should I know about heart disease, diabetes, and high blood pressure? Blood pressure and heart disease High blood pressure causes heart disease and increases the risk of stroke. This is more likely to develop in people who have high blood pressure readings or are overweight. Talk with your health care provider about your target blood pressure  readings. Have your blood pressure checked: Every 3-5 years if you are 86-70 years of age. Every year if you are 42 years old or older. If you are between the ages of 41 and 37 and are a current or former smoker, ask your health care provider if you should have a one-time screening for abdominal aortic aneurysm (AAA). Diabetes Have regular diabetes screenings. This checks your fasting blood sugar level. Have the screening done: Once every three years after age 77 if you are at a normal weight and have a low risk for diabetes. More often and at a younger age if you are overweight or have a high risk for diabetes. What should I know about preventing infection? Hepatitis B If you have a higher risk for hepatitis B, you should be screened for this virus. Talk with your health care provider to find out if you are at risk for hepatitis B infection. Hepatitis C Blood testing is recommended for: Everyone born from [birth year] through 1965. Anyone with known risk factors for hepatitis C. Sexually transmitted infections (STIs) You should be screened each year for STIs, including gonorrhea and chlamydia, if: You are sexually active and are younger than 87 years of age. You are older than 87 years of age and your health care provider tells you that you are at risk for this  type of infection. Your sexual activity has changed since you were last screened, and you are at increased risk for chlamydia or gonorrhea. Ask your health care provider if you are at risk. Ask your health care provider about whether you are at high risk for HIV. Your health care provider may recommend a prescription medicine to help prevent HIV infection. If you choose to take medicine to prevent HIV, you should first get tested for HIV. You should then be tested every 3 months for as long as you are taking the medicine. Follow these instructions at home: Alcohol use Do not drink alcohol if your health care provider tells you not to  drink. If you drink alcohol: Limit how much you have to 0-2 drinks a day. Know how much alcohol is in your drink. In the [place] one drink equals one 12 oz bottle of beer (355 mL), one 5 oz glass of wine (148 mL), or one 1 oz glass of hard liquor (44 mL). Lifestyle Do not use any products that contain nicotine or tobacco. These products include cigarettes, chewing tobacco, and vaping devices, such as e-cigarettes. If you need help quitting, ask your health care provider. Do not use street drugs. Do not share needles. Ask your health care provider for help if you need support or information about quitting drugs. General instructions Schedule regular health, dental, and eye exams. Stay current with your vaccines. Tell your health care provider if: You often feel depressed. You have ever been abused or do not feel safe at home. Summary Adopting a healthy lifestyle and getting preventive care are important in promoting health and wellness. Follow your health care provider's instructions about healthy diet, exercising, and getting tested or screened for diseases. Follow your health care provider's instructions on monitoring your cholesterol and blood pressure. This information is not intended to replace advice given to you by your health care provider. Make sure you discuss any questions you have with your health care provider. Document Revised: 05/24/2020 Document Reviewed: 05/24/2020 Elsevier Patient Education  2024 [employer].

## 2022-08-03 ENCOUNTER — Other Ambulatory Visit: Payer: Self-pay

## 2022-08-03 DIAGNOSIS — E7800 Pure hypercholesterolemia, unspecified: Secondary | ICD-10-CM

## 2022-08-03 MED ORDER — PRAVASTATIN SODIUM 40 MG PO TABS: 40.0000 mg | ORAL_TABLET | Freq: Every day | ORAL | 3 refills | Status: DC

## 2022-09-22 ENCOUNTER — Ambulatory Visit (INDEPENDENT_AMBULATORY_CARE_PROVIDER_SITE_OTHER): Payer: Medicare Other | Admitting: Physician Assistant

## 2022-09-22 ENCOUNTER — Encounter: Payer: Self-pay | Admitting: Physician Assistant

## 2022-09-22 VITALS — BP 104/53 | HR 57 | Ht 64.0 in | Wt 120.0 lb

## 2022-09-22 DIAGNOSIS — E782 Mixed hyperlipidemia: Secondary | ICD-10-CM

## 2022-09-22 DIAGNOSIS — I10 Essential (primary) hypertension: Secondary | ICD-10-CM

## 2022-09-22 DIAGNOSIS — I351 Nonrheumatic aortic (valve) insufficiency: Secondary | ICD-10-CM | POA: Diagnosis not present

## 2022-09-22 DIAGNOSIS — Z23 Encounter for immunization: Secondary | ICD-10-CM | POA: Diagnosis not present

## 2022-09-22 DIAGNOSIS — I952 Hypotension due to drugs: Secondary | ICD-10-CM

## 2022-09-22 MED ORDER — LISINOPRIL 2.5 MG PO TABS: 2.5000 mg | ORAL_TABLET | Freq: Every day | ORAL | 1 refills | Status: DC

## 2022-09-22 NOTE — Progress Notes (Unsigned)
Occasional dizziness. Low fluid intake. Flu shot given.

## 2022-09-22 NOTE — Patient Instructions (Signed)
Come back for labs when fasting

## 2022-09-25 DIAGNOSIS — I952 Hypotension due to drugs: Secondary | ICD-10-CM | POA: Insufficient documentation

## 2022-09-25 NOTE — Progress Notes (Signed)
Established Patient Office Visit  Subjective   Patient ID: Gregory Cuevas, male    DOB: 10/08/1933  Age: 89 y.o. MRN: [id number]  Chief Complaint  Patient presents with   Hypertension    HPI Pt is a 89 yo male with HTN and aortic regurgitation who presents to the clinic for follow up. He is doing well. He is very active. He has some occasional dizziness when he stands but no other issues. He denies any CP, palpitations. Headaches. He is compliant with medications. No recent falls.   .. Active Ambulatory Problems    Diagnosis Date Noted   Adenomatous polyp of colon 02/10/2010   Hyperlipidemia 02/11/2010   Cardiomegaly 01/19/2014   Diastolic dysfunction 03/02/2014   Mild aortic regurgitation 03/02/2014   ASD (atrial septal defect) 03/02/2014   Aortic insufficiency 03/02/2014   Essential hypertension, benign 11/03/2014   Acute depression 03/01/2015   Acute anxiety 03/01/2015   Constipation 03/18/2015   Nocturia 03/18/2015   Acute prostatitis 03/18/2015   Hyponatremia 06/22/2016   Fracture of great toe, right, closed 07/25/2016   Blind left eye 11/14/2016   Vision changes 11/14/2016   Chronic venous insufficiency 04/10/2017   Bilateral lower extremity edema 04/10/2017   Dizziness 04/10/2017   History of retinal detachment 04/10/2017   Benign prostatic hyperplasia without lower urinary tract symptoms 04/10/2017   Weakness 08/20/2019   Inguinal hernia, right 08/03/2020   Bilateral hearing loss due to cerumen impaction 02/14/2021   Senile purpura (HCC) 05/05/2022   Hypotension due to drugs 09/25/2022   Resolved Ambulatory Problems    Diagnosis Date Noted   Essential hypertension, benign 02/10/2010   DARK URINE 02/10/2010   Rhinorrhea 01/26/2014   Nasal congestion 01/26/2014   Adenomatous polyp of colon 08/04/2015   Past Medical History:  Diagnosis Date   AMI (acute myocardial infarction) (HCC) 1980   Aortic regurgitation    Colon cancer (HCC) 1990's   Detached retina     Hypertension      Review of Systems  All other systems reviewed and are negative.     Objective:     BP (!) 104/53 (BP Location: Left Arm, Patient Position: Sitting, Cuff Size: Small)   Pulse (!) 57   Ht 5\' 4"  (1.626 m)   Wt 120 lb (54.4 kg)   SpO2 99%   BMI 20.60 kg/m  BP Readings from Last 3 Encounters:  09/22/22 (!) 104/53  05/05/22 138/60  11/02/21 (!) 142/66   Wt Readings from Last 3 Encounters:  09/22/22 120 lb (54.4 kg)  05/05/22 122 lb 8 oz (55.6 kg)  11/02/21 124 lb (56.2 kg)    ..    09/22/2022    1:54 PM 07/04/2022   11:17 AM 05/05/2022    1:54 PM 11/02/2021   11:27 AM 06/28/2021    2:30 PM  Depression screen PHQ 2/9  Decreased Interest 0 0 0 1 0  Down, Depressed, Hopeless 0 1 0 1 0  PHQ - 2 Score 0 1 0 2 0  Altered sleeping    1 0  Tired, decreased energy    1 0  Change in appetite    0 0  Feeling bad or failure about yourself     1 0  Trouble concentrating    1 0  Moving slowly or fidgety/restless    1 0  Suicidal thoughts    0 0  PHQ-9 Score    7 0  Difficult doing work/chores    Not difficult at all  Not difficult at all     Physical Exam Constitutional:      Appearance: Normal appearance.  HENT:     Head: Normocephalic.  Neck:     Vascular: No carotid bruit.  Cardiovascular:     Rate and Rhythm: Normal rate and regular rhythm.     Heart sounds: Murmur heard.  Pulmonary:     Effort: Pulmonary effort is normal.     Breath sounds: Normal breath sounds.  Musculoskeletal:     Cervical back: Normal range of motion and neck supple.     Right lower leg: No edema.     Left lower leg: No edema.  Neurological:     General: No focal deficit present.     Mental Status: He is alert and oriented to person, place, and time.  Psychiatric:        Mood and Affect: Mood normal.       Assessment & Plan:  Gregory KitchenMarland KitchenMaycon was seen today for hypertension.  Diagnoses and all orders for this visit:  Essential hypertension, benign -     lisinopril (ZESTRIL)  2.5 MG tablet; Take 1 tablet (2.5 mg total) by mouth daily. -     CMP14+EGFR  Need for influenza vaccination -     Flu Vaccine QUAD High Dose(Fluad)  Mixed hyperlipidemia -     Lipid panel -     CMP14+EGFR  Mild aortic regurgitation -     CMP14+EGFR  Hypotension due to drugs   Needs fasting labs and will come back next week for these Due to low BP today and hx of dizziness from time to time when standing will decrease lisinopril to 2.5mg  daily Make sure staying hydrated Flu shot given today Follow up in 6 months or as needed    Return in about 6 months (around 03/22/2023).    Tandy Gaw, PA-C

## 2022-09-26 ENCOUNTER — Other Ambulatory Visit: Payer: Self-pay

## 2022-09-26 ENCOUNTER — Ambulatory Visit
Admission: EM | Admit: 2022-09-26 | Discharge: 2022-09-26 | Disposition: A | Discharge status: Home / Self Care | Payer: Medicare Other | Attending: Family Medicine | Admitting: Family Medicine

## 2022-09-26 DIAGNOSIS — I351 Nonrheumatic aortic (valve) insufficiency: Secondary | ICD-10-CM | POA: Diagnosis not present

## 2022-09-26 DIAGNOSIS — Z23 Encounter for immunization: Secondary | ICD-10-CM

## 2022-09-26 DIAGNOSIS — S0181XA Laceration without foreign body of other part of head, initial encounter: Secondary | ICD-10-CM | POA: Diagnosis not present

## 2022-09-26 DIAGNOSIS — E782 Mixed hyperlipidemia: Secondary | ICD-10-CM | POA: Diagnosis not present

## 2022-09-26 DIAGNOSIS — I10 Essential (primary) hypertension: Secondary | ICD-10-CM | POA: Diagnosis not present

## 2022-09-26 MED ORDER — TETANUS-DIPHTH-ACELL PERTUSSIS 5-2.5-18.5 LF-MCG/0.5 IM SUSY
0.5000 mL | PREFILLED_SYRINGE | Freq: Once | INTRAMUSCULAR | Status: AC
  Administered 2022-09-26: 0.5 mL via INTRAMUSCULAR
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
## 2022-09-26 NOTE — Discharge Instructions (Signed)
Keep bacitracin ointment or aquaphor on the wounds Watch for infection Stitches out in 5-7 days

## 2022-09-27 LAB — CMP14+EGFR
ALT: 12 IU/L (ref 0–44)
AST: 20 IU/L (ref 0–40)
Albumin: 4.1 g/dL (ref 3.7–4.7)
Alkaline Phosphatase: 72 IU/L (ref 44–121)
BUN/Creatinine Ratio: 11 (ref 10–24)
BUN: 11 mg/dL (ref 8–27)
Bilirubin Total: 0.7 mg/dL (ref 0.0–1.2)
CO2: 24 mmol/L (ref 20–29)
Calcium: 9.2 mg/dL (ref 8.6–10.2)
Chloride: 100 mmol/L (ref 96–106)
Creatinine, Ser: 0.98 mg/dL (ref 0.76–1.27)
Globulin, Total: 2 g/dL (ref 1.5–4.5)
Glucose: 79 mg/dL (ref 70–99)
Potassium: 4.6 mmol/L (ref 3.5–5.2)
Sodium: 137 mmol/L (ref 134–144)
Total Protein: 6.1 g/dL (ref 6.0–8.5)
eGFR: 75 mL/min/{1.73_m2} (ref >59)

## 2022-09-27 LAB — LIPID PANEL
Chol/HDL Ratio: 2.1 ratio (ref 0.0–5.0)
Cholesterol, Total: 201 mg/dL — ABNORMAL HIGH (ref 100–199)
HDL: 95 mg/dL (ref >39)
LDL Chol Calc (NIH): 91 mg/dL (ref 0–99)
Triglycerides: 87 mg/dL (ref 0–149)
VLDL Cholesterol Cal: 15 mg/dL (ref 5–40)

## 2022-09-27 NOTE — Progress Notes (Signed)
Labs look great! Continue on same medication.

## 2022-10-03 ENCOUNTER — Ambulatory Visit
Admission: EM | Admit: 2022-10-03 | Discharge: 2022-10-03 | Disposition: A | Discharge status: Home / Self Care | Payer: Medicare Other

## 2022-10-03 DIAGNOSIS — Z4802 Encounter for removal of sutures: Secondary | ICD-10-CM | POA: Diagnosis not present

## 2022-10-03 NOTE — ED Notes (Signed)
Verified number of sutures and location with michael, np.  Reviewed suture location as seen on patient

## 2022-10-03 NOTE — ED Triage Notes (Signed)
Sutures placed on 09/26/2022.  Patient is here today for removal of sutures.

## 2022-10-18 DIAGNOSIS — S2231XA Fracture of one rib, right side, initial encounter for closed fracture: Secondary | ICD-10-CM | POA: Diagnosis not present

## 2022-10-18 DIAGNOSIS — Z9181 History of falling: Secondary | ICD-10-CM | POA: Diagnosis not present

## 2022-10-18 DIAGNOSIS — S270XXA Traumatic pneumothorax, initial encounter: Secondary | ICD-10-CM | POA: Diagnosis not present

## 2022-10-18 DIAGNOSIS — T797XXA Traumatic subcutaneous emphysema, initial encounter: Secondary | ICD-10-CM | POA: Diagnosis not present

## 2022-10-18 DIAGNOSIS — S2241XA Multiple fractures of ribs, right side, initial encounter for closed fracture: Secondary | ICD-10-CM | POA: Diagnosis not present

## 2022-10-18 DIAGNOSIS — S0990XA Unspecified injury of head, initial encounter: Secondary | ICD-10-CM | POA: Diagnosis not present

## 2022-10-18 DIAGNOSIS — M25511 Pain in right shoulder: Secondary | ICD-10-CM | POA: Diagnosis not present

## 2022-10-19 DIAGNOSIS — S22088A Other fracture of T11-T12 vertebra, initial encounter for closed fracture: Secondary | ICD-10-CM | POA: Diagnosis not present

## 2022-10-19 DIAGNOSIS — E785 Hyperlipidemia, unspecified: Secondary | ICD-10-CM | POA: Diagnosis not present

## 2022-10-19 DIAGNOSIS — S32010A Wedge compression fracture of first lumbar vertebra, initial encounter for closed fracture: Secondary | ICD-10-CM | POA: Diagnosis not present

## 2022-10-19 DIAGNOSIS — M546 Pain in thoracic spine: Secondary | ICD-10-CM | POA: Diagnosis not present

## 2022-10-19 DIAGNOSIS — I351 Nonrheumatic aortic (valve) insufficiency: Secondary | ICD-10-CM | POA: Diagnosis not present

## 2022-10-19 DIAGNOSIS — J942 Hemothorax: Secondary | ICD-10-CM | POA: Diagnosis not present

## 2022-10-19 DIAGNOSIS — Z9181 History of falling: Secondary | ICD-10-CM | POA: Diagnosis not present

## 2022-10-19 DIAGNOSIS — S3993XA Unspecified injury of pelvis, initial encounter: Secondary | ICD-10-CM | POA: Diagnosis not present

## 2022-10-19 DIAGNOSIS — I6502 Occlusion and stenosis of left vertebral artery: Secondary | ICD-10-CM | POA: Diagnosis not present

## 2022-10-19 DIAGNOSIS — J982 Interstitial emphysema: Secondary | ICD-10-CM | POA: Diagnosis not present

## 2022-10-19 DIAGNOSIS — S3991XA Unspecified injury of abdomen, initial encounter: Secondary | ICD-10-CM | POA: Diagnosis not present

## 2022-10-19 DIAGNOSIS — M545 Low back pain, unspecified: Secondary | ICD-10-CM | POA: Diagnosis not present

## 2022-10-19 DIAGNOSIS — S272XXA Traumatic hemopneumothorax, initial encounter: Secondary | ICD-10-CM | POA: Diagnosis not present

## 2022-10-19 DIAGNOSIS — J939 Pneumothorax, unspecified: Secondary | ICD-10-CM | POA: Diagnosis not present

## 2022-10-19 DIAGNOSIS — W109XXA Fall (on) (from) unspecified stairs and steps, initial encounter: Secondary | ICD-10-CM | POA: Diagnosis not present

## 2022-10-19 DIAGNOSIS — J90 Pleural effusion, not elsewhere classified: Secondary | ICD-10-CM | POA: Diagnosis not present

## 2022-10-19 DIAGNOSIS — S298XXA Other specified injuries of thorax, initial encounter: Secondary | ICD-10-CM | POA: Diagnosis not present

## 2022-10-19 DIAGNOSIS — S0990XA Unspecified injury of head, initial encounter: Secondary | ICD-10-CM | POA: Diagnosis not present

## 2022-10-19 DIAGNOSIS — I252 Old myocardial infarction: Secondary | ICD-10-CM | POA: Diagnosis not present

## 2022-10-19 DIAGNOSIS — S2241XA Multiple fractures of ribs, right side, initial encounter for closed fracture: Secondary | ICD-10-CM | POA: Diagnosis not present

## 2022-10-19 DIAGNOSIS — S22068A Other fracture of T7-T8 thoracic vertebra, initial encounter for closed fracture: Secondary | ICD-10-CM | POA: Diagnosis not present

## 2022-10-19 DIAGNOSIS — S22080A Wedge compression fracture of T11-T12 vertebra, initial encounter for closed fracture: Secondary | ICD-10-CM | POA: Diagnosis not present

## 2022-10-19 DIAGNOSIS — Z85038 Personal history of other malignant neoplasm of large intestine: Secondary | ICD-10-CM | POA: Diagnosis not present

## 2022-10-19 DIAGNOSIS — S27321A Contusion of lung, unilateral, initial encounter: Secondary | ICD-10-CM | POA: Diagnosis not present

## 2022-10-19 DIAGNOSIS — S22069A Unspecified fracture of T7-T8 vertebra, initial encounter for closed fracture: Secondary | ICD-10-CM | POA: Diagnosis not present

## 2022-10-19 DIAGNOSIS — I10 Essential (primary) hypertension: Secondary | ICD-10-CM | POA: Diagnosis not present

## 2022-10-19 DIAGNOSIS — I7774 Dissection of vertebral artery: Secondary | ICD-10-CM | POA: Diagnosis not present

## 2022-10-19 DIAGNOSIS — I6501 Occlusion and stenosis of right vertebral artery: Secondary | ICD-10-CM | POA: Diagnosis not present

## 2022-10-19 DIAGNOSIS — Z7982 Long term (current) use of aspirin: Secondary | ICD-10-CM | POA: Diagnosis not present

## 2022-10-19 DIAGNOSIS — S32018A Other fracture of first lumbar vertebra, initial encounter for closed fracture: Secondary | ICD-10-CM | POA: Diagnosis not present

## 2022-11-03 DIAGNOSIS — S32000D Wedge compression fracture of unspecified lumbar vertebra, subsequent encounter for fracture with routine healing: Secondary | ICD-10-CM | POA: Diagnosis not present

## 2022-11-03 DIAGNOSIS — S32000A Wedge compression fracture of unspecified lumbar vertebra, initial encounter for closed fracture: Secondary | ICD-10-CM | POA: Diagnosis not present

## 2022-11-03 DIAGNOSIS — S22000A Wedge compression fracture of unspecified thoracic vertebra, initial encounter for closed fracture: Secondary | ICD-10-CM | POA: Diagnosis not present

## 2022-11-08 DIAGNOSIS — S2241XD Multiple fractures of ribs, right side, subsequent encounter for fracture with routine healing: Secondary | ICD-10-CM | POA: Diagnosis not present

## 2022-11-08 DIAGNOSIS — W108XXA Fall (on) (from) other stairs and steps, initial encounter: Secondary | ICD-10-CM | POA: Diagnosis not present

## 2022-11-17 ENCOUNTER — Ambulatory Visit (INDEPENDENT_AMBULATORY_CARE_PROVIDER_SITE_OTHER): Payer: Medicare Other | Admitting: Physician Assistant

## 2022-11-17 ENCOUNTER — Encounter: Payer: Self-pay | Admitting: Physician Assistant

## 2022-11-17 VITALS — BP 171/69 | HR 78 | Ht 64.0 in | Wt 118.0 lb

## 2022-11-17 DIAGNOSIS — S2242XD Multiple fractures of ribs, left side, subsequent encounter for fracture with routine healing: Secondary | ICD-10-CM | POA: Diagnosis not present

## 2022-11-17 DIAGNOSIS — I10 Essential (primary) hypertension: Secondary | ICD-10-CM

## 2022-11-17 DIAGNOSIS — S22000A Wedge compression fracture of unspecified thoracic vertebra, initial encounter for closed fracture: Secondary | ICD-10-CM

## 2022-11-17 DIAGNOSIS — Z9181 History of falling: Secondary | ICD-10-CM | POA: Diagnosis not present

## 2022-11-17 DIAGNOSIS — I351 Nonrheumatic aortic (valve) insufficiency: Secondary | ICD-10-CM | POA: Diagnosis not present

## 2022-11-17 NOTE — Progress Notes (Signed)
Established Patient Office Visit  Subjective   Patient ID: Gregory Cuevas, male    DOB: [birth year]  Age: 89 y.o. MRN: [id number]   HPI Gregory Cuevas is an 89 year old male in the clinic today for follow up after falling. He is accompanied by his son today. He has had two falls in the past month. One he had fallen off of his bike. He is no longer biking, he has started using a stationary bike at home. The more recent fall, he was walking backwards down some step sweeping when he missed the last step and fell over on a cinder block. He went to the ED and it was found that he had broken his 6,7,8, and 9th rib and bruised his 10th. It was also revealed that he had some compression fractures in his spine but it is unclear if this occurred before or after the fall. He denies and dizziness and light headedness before the fall. He denies LOC or hitting his head. He states his pain is getting better. He will use tylenol occasionally and if it gets really bad he will use a pain pill. He is eating and sleeping okay.  His son is concerned he is not using his incentive spirometer enough or correctly. He states that he will only use it when he comes around and then when he does he does not take deep enough breaths. He has had a bit of a cough with some mucus in his throat. Denies fever, chills, and nasal congestion.  .. Active Ambulatory Problems    Diagnosis Date Noted   Adenomatous polyp of colon 02/10/2010   Hyperlipidemia 02/11/2010   Cardiomegaly 01/19/2014   Diastolic dysfunction 03/02/2014   Mild aortic regurgitation 03/02/2014   ASD (atrial septal defect) 03/02/2014   Aortic insufficiency 03/02/2014   Essential hypertension, benign 11/03/2014   Acute depression 03/01/2015   Acute anxiety 03/01/2015   Constipation 03/18/2015   Nocturia 03/18/2015   Acute prostatitis 03/18/2015   Hyponatremia 06/22/2016   Fracture of great toe, right, closed 07/25/2016   Blind left eye 11/14/2016   Vision changes  11/14/2016   Chronic venous insufficiency 04/10/2017   Bilateral lower extremity edema 04/10/2017   Dizziness 04/10/2017   History of retinal detachment 04/10/2017   Benign prostatic hyperplasia without lower urinary tract symptoms 04/10/2017   Weakness 08/20/2019   Inguinal hernia, right 08/03/2020   Bilateral hearing loss due to cerumen impaction 02/14/2021   Senile purpura (HCC) 05/05/2022   Hypotension due to drugs 09/25/2022   At high risk for falls 11/17/2022   Resolved Ambulatory Problems    Diagnosis Date Noted   Essential hypertension, benign 02/10/2010   DARK URINE 02/10/2010   Rhinorrhea 01/26/2014   Nasal congestion 01/26/2014   Adenomatous polyp of colon 08/04/2015   Past Medical History:  Diagnosis Date   AMI (acute myocardial infarction) (HCC) 1980   Aortic regurgitation    Colon cancer (HCC) 1990's   Detached retina    Hypertension      ROS See HPI   Objective:     BP (!) 171/69   Pulse 78   Ht 5\' 4"  (1.626 m)   Wt 118 lb (53.5 kg)   SpO2 99%   BMI 20.25 kg/m  BP Readings from Last 3 Encounters:  11/17/22 (!) 171/69  10/03/22 (!) 177/87  09/26/22 (!) 159/73   Wt Readings from Last 3 Encounters:  11/17/22 118 lb (53.5 kg)  09/22/22 120 lb (54.4 kg)  05/05/22 122  lb 8 oz (55.6 kg)      Physical Exam Constitutional:      Appearance: Normal appearance.  HENT:     Head: Normocephalic and atraumatic.  Cardiovascular:     Rate and Rhythm: Normal rate and regular rhythm.     Pulses: Normal pulses.     Heart sounds: Normal heart sounds.  Pulmonary:     Effort: Pulmonary effort is normal.     Comments: Mild crackling in bilateral bases of the lungs Musculoskeletal:     Right lower leg: No edema.     Left lower leg: No edema.  Neurological:     General: No focal deficit present.     Mental Status: He is alert and oriented to person, place, and time.  Psychiatric:        Mood and Affect: Mood normal.        Assessment & Plan:   Marland KitchenMarland KitchenDiagnoses and all orders for this visit:  At high risk for falls  Compression fracture of body of thoracic vertebra (HCC)  Closed fracture of multiple ribs of left side with routine healing, subsequent encounter  Mild aortic regurgitation  Essential hypertension, benign    Gave instructions on how to use incentive spirometer and educated on importance for prevention of pneumonia. Following up with ortho on compression fractures, pain is well manageable.  Educated on fall risk reduction. Suggested using a cane occasionally if feeling unstable or if walking on uneven ground. Recommended to look for things around the house that may cause a fall.  Recommended working on balance with bosu ball at home. Instructed to only do when others are around and to make sure he has something to catch himself if he feels as though he may fall. Consider formal PT in near future.  Recommended Mucinex to clear congestion BP elevated today and on recheck. Encouraged to BP more regularly at home.  Follow up in 4 weeks to recheck BP and lungs.   Return in about 4 weeks (around 12/15/2022) for Follow up.    Tandy Gaw, PA-C

## 2022-11-17 NOTE — Patient Instructions (Signed)
Incentive spirometer every hour Mucinex as needed up to twice a day  Fall Prevention in the Home, Adult Falls can cause injuries and affect people of all ages. There are many simple things that you can do to make your home safe and to help prevent falls. If you need it, ask for help making these changes. What actions can I take to prevent falls? General information Use good lighting in all rooms. Make sure to: Replace any light bulbs that burn out. Turn on lights if it is dark and use night-lights. Keep items that you use often in easy-to-reach places. Lower the shelves around your home if needed. Move furniture so that there are clear paths around it. Do not keep throw rugs or other things on the floor that can make you trip. If any of your floors are uneven, fix them. Add color or contrast paint or tape to clearly mark and help you see: Grab bars or handrails. First and last steps of staircases. Where the edge of each step is. If you use a ladder or stepladder: Make sure that it is fully opened. Do not climb a closed ladder. Make sure the sides of the ladder are locked in place. Have someone hold the ladder while you use it. Know where your pets are as you move through your home. What can I do in the bathroom?     Keep the floor dry. Clean up any water that is on the floor right away. Remove soap buildup in the bathtub or shower. Buildup makes bathtubs and showers slippery. Use non-skid mats or decals on the floor of the bathtub or shower. Attach bath mats securely with double-sided, non-slip rug tape. If you need to sit down while you are in the shower, use a non-slip stool. Install grab bars by the toilet and in the bathtub and shower. Do not use towel bars as grab bars. What can I do in the bedroom? Make sure that you have a light by your bed that is easy to reach. Do not use any sheets or blankets on your bed that hang to the floor. Have a firm bench or chair with side arms  that you can use for support when you get dressed. What can I do in the kitchen? Clean up any spills right away. If you need to reach something above you, use a sturdy step stool that has a grab bar. Keep electrical cables out of the way. Do not use floor polish or wax that makes floors slippery. What can I do with my stairs? Do not leave anything on the stairs. Make sure that you have a light switch at the top and the bottom of the stairs. Have them installed if you do not have them. Make sure that there are handrails on both sides of the stairs. Fix handrails that are broken or loose. Make sure that handrails are as long as the staircases. Install non-slip stair treads on all stairs in your home if they do not have carpet. Avoid having throw rugs at the top or bottom of stairs, or secure the rugs with carpet tape to prevent them from moving. Choose a carpet design that does not hide the edge of steps on the stairs. Make sure that carpet is firmly attached to the stairs. Fix any carpet that is loose or worn. What can I do on the outside of my home? Use bright outdoor lighting. Repair the edges of walkways and driveways and fix any cracks. Clear paths  of anything that can make you trip, such as tools or rocks. Add color or contrast paint or tape to clearly mark and help you see high doorway thresholds. Trim any bushes or trees on the main path into your home. Check that handrails are securely fastened and in good repair. Both sides of all steps should have handrails. Install guardrails along the edges of any raised decks or porches. Have leaves, snow, and ice cleared regularly. Use sand, salt, or ice melt on walkways during winter months if you live where there is ice and snow. In the garage, clean up any spills right away, including grease or oil spills. What other actions can I take? Review your medicines with your health care provider. Some medicines can make you confused or feel dizzy.  This can increase your chance of falling. Wear closed-toe shoes that fit well and support your feet. Wear shoes that have rubber soles and low heels. Use a cane, walker, scooter, or crutches that help you move around if needed. Talk with your provider about other ways that you can decrease your risk of falls. This may include seeing a physical therapist to learn to do exercises to improve movement and strength. Where to find more information Centers for Disease Control and Prevention, STEAD[employer]: TonerPromos.no [employer] on Aging: BaseRingTones.pl National Institute on Aging: BaseRingTones.pl Contact a health care provider if: You are afraid of falling at home. You feel weak, drowsy, or dizzy at home. You fall at home. Get help right away if you: Lose consciousness or have trouble moving after a fall. Have a fall that causes a head injury. These symptoms may be an emergency. Get help right away. Call 911. Do not wait to see if the symptoms will go away. Do not drive yourself to the hospital. This information is not intended to replace advice given to you by your health care provider. Make sure you discuss any questions you have with your health care provider. Document Revised: 09/05/2021 Document Reviewed: 09/05/2021 Elsevier Patient Education  2024 [employer].

## 2022-12-04 DIAGNOSIS — S32000A Wedge compression fracture of unspecified lumbar vertebra, initial encounter for closed fracture: Secondary | ICD-10-CM | POA: Diagnosis not present

## 2022-12-04 DIAGNOSIS — S32000D Wedge compression fracture of unspecified lumbar vertebra, subsequent encounter for fracture with routine healing: Secondary | ICD-10-CM | POA: Diagnosis not present

## 2022-12-04 DIAGNOSIS — S22000A Wedge compression fracture of unspecified thoracic vertebra, initial encounter for closed fracture: Secondary | ICD-10-CM | POA: Diagnosis not present

## 2022-12-04 DIAGNOSIS — M545 Low back pain, unspecified: Secondary | ICD-10-CM | POA: Diagnosis not present

## 2022-12-18 ENCOUNTER — Encounter: Payer: Self-pay | Admitting: Physician Assistant

## 2022-12-18 ENCOUNTER — Ambulatory Visit (INDEPENDENT_AMBULATORY_CARE_PROVIDER_SITE_OTHER): Payer: Medicare Other | Admitting: Physician Assistant

## 2022-12-18 VITALS — BP 142/60 | HR 62

## 2022-12-18 DIAGNOSIS — I10 Essential (primary) hypertension: Secondary | ICD-10-CM | POA: Diagnosis not present

## 2022-12-18 DIAGNOSIS — Z9181 History of falling: Secondary | ICD-10-CM | POA: Diagnosis not present

## 2022-12-18 DIAGNOSIS — G479 Sleep disorder, unspecified: Secondary | ICD-10-CM | POA: Diagnosis not present

## 2022-12-18 NOTE — Progress Notes (Unsigned)
   Established Patient Office Visit  Subjective   Patient ID: Gregory Cuevas, male    DOB: 1933-05-10  Age: 89 y.o. MRN: [id number]  Chief Complaint  Patient presents with   Medical Management of Chronic Issues    Fup 1 mo after fall, and cough     HPI Follow up after 2 falls earlier this year. He has not since had a fall. Feeling well. Sometimes has problems with balance, but it has been getting better. He has been trying to move around more. He no longer rides bicycles. Denies changes in vision. He does not use a walker or cane.  Patient also mentioned having trouble sleeping.  Review of Systems  Eyes:  Negative for blurred vision and double vision.  Respiratory:  Positive for shortness of breath.   Cardiovascular:  Negative for chest pain.  Neurological:  Negative for dizziness and headaches.     Objective:     BP (!) 134/48   Pulse 62  BP Readings from Last 3 Encounters:  12/18/22 (!) 142/60  11/17/22 (!) 171/69  10/03/22 (!) 177/87      Physical Exam Cardiovascular:     Rate and Rhythm: Normal rate and regular rhythm.     Heart sounds: Normal heart sounds.  Pulmonary:     Effort: Pulmonary effort is normal.     Breath sounds: Normal breath sounds.     No results found for any visits on 12/18/22.   The ASCVD Risk score (Arnett DK, et al., 2019) failed to calculate for the following reasons:   The 2019 ASCVD risk score is only valid for ages 79 to 36    Assessment & Plan:   Problem List Items Addressed This Visit       Cardiovascular and Mediastinum   Essential hypertension, benign - Primary   Recommended shower and toilet holds, using hand railings down stairs, de-cluttering walk ways, and making sure there is adequate lighting to prevent falls. Continue to stay active. Discussed PT with patient but patient declines at this time.  DBP low on 1st measurement but 60 on manual. BP not to goal on lisinopril. Continue to monitor BP.  Recommended Mg 400 mg  for sleep.  Regular 6 mo f/u. On statin. No follow-ups on file.    AnnaCollin M Mace, Student-PA

## 2022-12-18 NOTE — Patient Instructions (Signed)
Magnesium 400 mg for sleep (

## 2022-12-19 ENCOUNTER — Encounter: Payer: Self-pay | Admitting: Physician Assistant

## 2022-12-19 DIAGNOSIS — G479 Sleep disorder, unspecified: Secondary | ICD-10-CM | POA: Insufficient documentation

## 2023-01-26 ENCOUNTER — Ambulatory Visit (INDEPENDENT_AMBULATORY_CARE_PROVIDER_SITE_OTHER): Payer: Medicare Other

## 2023-01-26 ENCOUNTER — Ambulatory Visit (INDEPENDENT_AMBULATORY_CARE_PROVIDER_SITE_OTHER): Payer: Medicare Other | Admitting: Physician Assistant

## 2023-01-26 VITALS — BP 149/56 | HR 66 | Ht 64.0 in | Wt 118.0 lb

## 2023-01-26 DIAGNOSIS — S2241XD Multiple fractures of ribs, right side, subsequent encounter for fracture with routine healing: Secondary | ICD-10-CM | POA: Diagnosis not present

## 2023-01-26 DIAGNOSIS — R0781 Pleurodynia: Secondary | ICD-10-CM

## 2023-01-26 DIAGNOSIS — S2241XA Multiple fractures of ribs, right side, initial encounter for closed fracture: Secondary | ICD-10-CM

## 2023-01-26 DIAGNOSIS — S32000D Wedge compression fracture of unspecified lumbar vertebra, subsequent encounter for fracture with routine healing: Secondary | ICD-10-CM | POA: Diagnosis not present

## 2023-01-26 NOTE — Progress Notes (Signed)
 Established Patient Office Visit  Subjective   Patient ID: Gregory Cuevas, male    DOB: 16-Apr-1933  Age: 89 y.o. MRN: [id number]  Chief Complaint  Patient presents with   Fall    Pt states  he had a fall a 2 mo ago , still having discomfort by the right side rib cage,discomfort is mostly when laying down     HPI Pt is a 89 yo male who presents to the clinic with his son for worsening discomfort of right lower ribs for the last 8days. He has a history of lower right rib fractures for 6,7,8,10,12 and lumbar/thoracic compression fractures after fall in October of last year. He denies any recent falls but 8 days ago he started having more discomfort in his right lower anterior rib area. Not done anything for it. No recent imaging.   .. Active Ambulatory Problems    Diagnosis Date Noted   Adenomatous polyp of colon 02/10/2010   Hyperlipidemia 02/11/2010   Cardiomegaly 01/19/2014   Diastolic dysfunction 03/02/2014   Mild aortic regurgitation 03/02/2014   ASD (atrial septal defect) 03/02/2014   Aortic insufficiency 03/02/2014   Essential hypertension, benign 11/03/2014   Acute depression 03/01/2015   Acute anxiety 03/01/2015   Constipation 03/18/2015   Nocturia 03/18/2015   Acute prostatitis 03/18/2015   Hyponatremia 06/22/2016   Fracture of great toe, right, closed 07/25/2016   Blind left eye 11/14/2016   Vision changes 11/14/2016   Chronic venous insufficiency 04/10/2017   Bilateral lower extremity edema 04/10/2017   Dizziness 04/10/2017   History of retinal detachment 04/10/2017   Benign prostatic hyperplasia without lower urinary tract symptoms 04/10/2017   Weakness 08/20/2019   Inguinal hernia, right 08/03/2020   Bilateral hearing loss due to cerumen impaction 02/14/2021   Senile purpura (HCC) 05/05/2022   Hypotension due to drugs 09/25/2022   At high risk for falls 11/17/2022   Trouble in sleeping 12/19/2022   Lumbar compression fracture, with routine healing, subsequent  encounter 01/29/2023   Multiple closed fractures of ribs of right side 01/29/2023   Rib pain on right side 01/29/2023   Resolved Ambulatory Problems    Diagnosis Date Noted   Essential hypertension, benign 02/10/2010   DARK URINE 02/10/2010   Rhinorrhea 01/26/2014   Nasal congestion 01/26/2014   Adenomatous polyp of colon 08/04/2015   Past Medical History:  Diagnosis Date   AMI (acute myocardial infarction) (HCC) 1980   Aortic regurgitation    Colon cancer (HCC) 1990's   Detached retina    Hypertension      ROS See HPI.    Objective:     BP (!) 149/56   Pulse 66   Ht 5' 4 (1.626 m)   Wt 118 lb (53.5 kg)   SpO2 99%   BMI 20.25 kg/m  BP Readings from Last 3 Encounters:  01/26/23 (!) 149/56  12/18/22 (!) 142/60  11/17/22 (!) 171/69   Wt Readings from Last 3 Encounters:  01/26/23 118 lb (53.5 kg)  11/17/22 118 lb (53.5 kg)  09/22/22 120 lb (54.4 kg)    STAT XRAY:   IMPRESSION: Moderately to severely displaced right seventh, eighth, ninth and tenth rib fractures.  Physical Exam Constitutional:      Comments: thin  HENT:     Head: Normocephalic.  Cardiovascular:     Rate and Rhythm: Normal rate.     Heart sounds: Murmur heard.  Pulmonary:     Effort: Pulmonary effort is normal.  Musculoskeletal:  Comments: Tenderness over lower right ribs to palpation with step off noted anterior over 8th,7th,6th.   Neurological:     General: No focal deficit present.     Mental Status: He is alert.  Psychiatric:        Mood and Affect: Mood normal.          Assessment & Plan:  SABRASABRAKairav was seen today for fall.  Diagnoses and all orders for this visit:  Closed fracture of multiple ribs of right side, initial encounter -     DG Bone Density; Future  Rib pain on right side -     DG Ribs Unilateral W/Chest Right; Future -     DG Bone Density; Future  Lumbar compression fracture, with routine healing, subsequent encounter   Xray shows 7,8,9, 10 th  moderately displaced rib fractures Called son and now sure if this is old or new there does not appear to be a lot of signs of healing so makes me concerned it could be a new fracture due to new injury Discussed to make sure on vitamin D 1000 units and calcium 1300mg  daily  Need bone density to consider medication to help build bone and prevent future fractures Son will discuss with patient For current discomfort topical diclofenac gel and heating pad Keep deep breathing to avoid pneumonia Follow up after bone density or sooner if needed   Vermell Bologna, PA-C

## 2023-01-26 NOTE — Progress Notes (Signed)
 Discussed results with son. I do think these represent new and different rib fractures from 10/2022. Will order bone density and discussed starting a bone density medication. Son will talk with father. Discussed importance of talking vitamin D 1000 units and calcium 1300mg  daily.

## 2023-01-26 NOTE — Patient Instructions (Addendum)
 Get xray today downstairs Use diclofenac cream over area and consider heating pad No lifting over 10lbs  1300mg  calcium and vitamin 1000-2000units

## 2023-01-28 ENCOUNTER — Other Ambulatory Visit: Payer: Self-pay | Admitting: Physician Assistant

## 2023-01-28 DIAGNOSIS — I10 Essential (primary) hypertension: Secondary | ICD-10-CM

## 2023-01-28 DIAGNOSIS — I517 Cardiomegaly: Secondary | ICD-10-CM

## 2023-01-28 DIAGNOSIS — I351 Nonrheumatic aortic (valve) insufficiency: Secondary | ICD-10-CM

## 2023-01-29 ENCOUNTER — Encounter: Payer: Self-pay | Admitting: Physician Assistant

## 2023-01-29 DIAGNOSIS — S32000D Wedge compression fracture of unspecified lumbar vertebra, subsequent encounter for fracture with routine healing: Secondary | ICD-10-CM | POA: Insufficient documentation

## 2023-01-29 DIAGNOSIS — S2241XA Multiple fractures of ribs, right side, initial encounter for closed fracture: Secondary | ICD-10-CM | POA: Insufficient documentation

## 2023-01-29 DIAGNOSIS — R0781 Pleurodynia: Secondary | ICD-10-CM | POA: Insufficient documentation

## 2023-01-30 DIAGNOSIS — W108XXA Fall (on) (from) other stairs and steps, initial encounter: Secondary | ICD-10-CM | POA: Diagnosis not present

## 2023-01-30 DIAGNOSIS — S2241XD Multiple fractures of ribs, right side, subsequent encounter for fracture with routine healing: Secondary | ICD-10-CM | POA: Diagnosis not present

## 2023-03-23 ENCOUNTER — Ambulatory Visit: Payer: Medicare Other | Admitting: Physician Assistant

## 2023-03-26 ENCOUNTER — Ambulatory Visit (INDEPENDENT_AMBULATORY_CARE_PROVIDER_SITE_OTHER): Payer: Medicare Other | Admitting: Physician Assistant

## 2023-03-26 ENCOUNTER — Encounter: Payer: Self-pay | Admitting: Physician Assistant

## 2023-03-26 VITALS — BP 145/71 | HR 69 | Ht 64.0 in | Wt 116.5 lb

## 2023-03-26 DIAGNOSIS — Z23 Encounter for immunization: Secondary | ICD-10-CM

## 2023-03-26 DIAGNOSIS — R770 Abnormality of albumin: Secondary | ICD-10-CM

## 2023-03-26 DIAGNOSIS — I10 Essential (primary) hypertension: Secondary | ICD-10-CM | POA: Diagnosis not present

## 2023-03-26 DIAGNOSIS — S2241XA Multiple fractures of ribs, right side, initial encounter for closed fracture: Secondary | ICD-10-CM

## 2023-03-26 DIAGNOSIS — E871 Hypo-osmolality and hyponatremia: Secondary | ICD-10-CM | POA: Diagnosis not present

## 2023-03-26 DIAGNOSIS — I351 Nonrheumatic aortic (valve) insufficiency: Secondary | ICD-10-CM | POA: Diagnosis not present

## 2023-03-26 NOTE — Progress Notes (Signed)
 Established Patient Office Visit  Subjective   Patient ID: Gregory Cuevas, male    DOB: 1933-11-20  Age: 89 y.o. MRN: [id number]  CC: 6 month follow up  HPI Patient is a 89 yo male with HTN,  aortic regurgitation, s/p multiple right rib fractures who presents to the clinic with son. He is doing well but he did not take his blood pressure medication this morning. He is on lisinopril 2.5mg  daily. He is not checking his blood pressures at home.  He denies any CP, palpitations, headaches, vision changes or SOB.   He has a history of right rib fractures (7 &8) from a fall in October than pain worsened in January.  He is not using anything for the pain except for the occasional tylenol at night. He states it hurts worse at night. He did see general surgery and decided on conservative treatment for rib fractures.   .. Active Ambulatory Problems    Diagnosis Date Noted   Adenomatous polyp of colon 02/10/2010   Hyperlipidemia 02/11/2010   Cardiomegaly 01/19/2014   Diastolic dysfunction 03/02/2014   Mild aortic regurgitation 03/02/2014   ASD (atrial septal defect) 03/02/2014   Aortic insufficiency 03/02/2014   Essential hypertension, benign 11/03/2014   Acute depression 03/01/2015   Acute anxiety 03/01/2015   Constipation 03/18/2015   Nocturia 03/18/2015   Acute prostatitis 03/18/2015   Hyponatremia 06/22/2016   Fracture of great toe, right, closed 07/25/2016   Blind left eye 11/14/2016   Vision changes 11/14/2016   Chronic venous insufficiency 04/10/2017   Bilateral lower extremity edema 04/10/2017   Dizziness 04/10/2017   History of retinal detachment 04/10/2017   Benign prostatic hyperplasia without lower urinary tract symptoms 04/10/2017   Weakness 08/20/2019   Inguinal hernia, right 08/03/2020   Bilateral hearing loss due to cerumen impaction 02/14/2021   Senile purpura (HCC) 05/05/2022   Hypotension due to drugs 09/25/2022   At high risk for falls 11/17/2022   Trouble in sleeping  12/19/2022   Lumbar compression fracture, with routine healing, subsequent encounter 01/29/2023   Multiple closed fractures of ribs of right side 01/29/2023   Rib pain on right side 01/29/2023   Low serum albumin 03/27/2023   Resolved Ambulatory Problems    Diagnosis Date Noted   Essential hypertension, benign 02/10/2010   DARK URINE 02/10/2010   Rhinorrhea 01/26/2014   Nasal congestion 01/26/2014   Adenomatous polyp of colon 08/04/2015   Past Medical History:  Diagnosis Date   AMI (acute myocardial infarction) (HCC) 1980   Aortic regurgitation    Colon cancer (HCC) 1990's   Detached retina    Hypertension      Review of Systems  All other systems reviewed and are negative.  Objective:   BP (!) 145/71   Pulse 69   Ht 5\' 4"  (1.626 m)   Wt 116 lb 8 oz (52.8 kg)   SpO2 96%   BMI 20.00 kg/m  BP Readings from Last 3 Encounters:  03/26/23 (!) 145/71  01/26/23 (!) 149/56  12/18/22 (!) 142/60   Wt Readings from Last 3 Encounters:  03/26/23 116 lb 8 oz (52.8 kg)  01/26/23 118 lb (53.5 kg)  11/17/22 118 lb (53.5 kg)   SpO2 Readings from Last 3 Encounters:  03/26/23 96%  01/26/23 99%  11/17/22 99%   Physical Exam Constitutional:      Appearance: Normal appearance.  HENT:     Head: Normocephalic and atraumatic.  Cardiovascular:     Rate and Rhythm: Normal rate  and regular rhythm.     Pulses: Normal pulses.     Heart sounds: Murmur heard.  Pulmonary:     Effort: Pulmonary effort is normal.     Breath sounds: Normal breath sounds.  Musculoskeletal:     Right lower leg: No edema.     Left lower leg: No edema.     Comments: Tenderness to palpation over right lower ribs.   Skin:    General: Skin is warm.     Capillary Refill: Capillary refill takes less than 2 seconds.  Neurological:     General: No focal deficit present.     Mental Status: He is alert and oriented to person, place, and time.  Psychiatric:        Mood and Affect: Mood normal.      Assessment  & Plan:   Marland KitchenMarland KitchenShivan was seen today for medical management of chronic issues.  Diagnoses and all orders for this visit:  Essential hypertension, benign -     CMP14+EGFR -     lisinopril (ZESTRIL) 2.5 MG tablet; Take 1 tablet (2.5 mg total) by mouth daily.  Mild aortic regurgitation -     CMP14+EGFR  Closed fracture of multiple ribs of right side, initial encounter  Hyponatremia -     CMP14+EGFR  Encounter for immunization -     Pfizer Comirnaty Covid-19 Vaccine 27yrs & older  Low serum albumin -     CMP14+EGFR   BP better on 2nd recheck Continue on lisinopril 2.5mg  and keep BP log at home Hx of hyponateremia and low albumin Cmp ordered today Covid booster given without complication Continue icing/heating rib fractures and using tylenol as needed for pain.  Follow up in 6 months   Tandy Gaw, PA-C

## 2023-03-26 NOTE — Patient Instructions (Signed)
 Continue to take lisinopril 2.5mg  daily. Check BP at home.

## 2023-03-27 ENCOUNTER — Encounter: Payer: Self-pay | Admitting: Physician Assistant

## 2023-03-27 DIAGNOSIS — R770 Abnormality of albumin: Secondary | ICD-10-CM | POA: Insufficient documentation

## 2023-03-27 LAB — CMP14+EGFR
ALT: 11 IU/L (ref 0–44)
AST: 21 IU/L (ref 0–40)
Albumin: 4.4 g/dL (ref 3.7–4.7)
Alkaline Phosphatase: 90 IU/L (ref 44–121)
BUN/Creatinine Ratio: 17 (ref 10–24)
BUN: 18 mg/dL (ref 8–27)
Bilirubin Total: 0.9 mg/dL (ref 0.0–1.2)
CO2: 24 mmol/L (ref 20–29)
Calcium: 9.7 mg/dL (ref 8.6–10.2)
Chloride: 100 mmol/L (ref 96–106)
Creatinine, Ser: 1.03 mg/dL (ref 0.76–1.27)
Globulin, Total: 2.3 g/dL (ref 1.5–4.5)
Glucose: 81 mg/dL (ref 70–99)
Potassium: 4.4 mmol/L (ref 3.5–5.2)
Sodium: 137 mmol/L (ref 134–144)
Total Protein: 6.7 g/dL (ref 6.0–8.5)
eGFR: 70 mL/min/{1.73_m2} (ref >59)

## 2023-03-27 MED ORDER — LISINOPRIL 2.5 MG PO TABS: 2.5000 mg | ORAL_TABLET | Freq: Every day | ORAL | 1 refills | Status: DC

## 2023-03-27 NOTE — Progress Notes (Signed)
 Labs look GREAT. Protein is back up. Electrolytes look good!

## 2023-06-18 ENCOUNTER — Ambulatory Visit: Payer: Medicare Other | Admitting: Physician Assistant

## 2023-07-10 ENCOUNTER — Ambulatory Visit: Payer: Medicare Other

## 2023-07-10 VITALS — Ht 64.0 in | Wt 116.0 lb

## 2023-07-10 DIAGNOSIS — Z0000 Encounter for general adult medical examination without abnormal findings: Secondary | ICD-10-CM

## 2023-07-10 NOTE — Progress Notes (Signed)
 Subjective:   Gregory Cuevas is a 89 y.o. male who presents for Medicare Annual/Subsequent preventive examination.  Visit Complete: Virtual I connected with  Mancel Alice on 07/10/23 by a audio enabled telemedicine application and verified that I am speaking with the correct person using two identifiers.  Patient Location: Home  Provider Location: Office/Clinic  I discussed the limitations of evaluation and management by telemedicine. The patient expressed understanding and agreed to proceed.  Vital Signs: Because this visit was a virtual/telehealth visit, some criteria may be missing or patient reported. Any vitals not documented were not able to be obtained and vitals that have been documented are patient reported.  Patient Medicare AWV questionnaire was completed by the patient on n/a; I have confirmed that all information answered by patient is correct and no changes since this date.  Cardiac Risk Factors include: advanced age (>25men, >68 women);male gender;hypertension;dyslipidemia     Objective:    Today's Vitals   07/10/23 1422  Weight: 116 lb (52.6 kg)  Height: 5' 4 (1.626 m)   Body mass index is 19.91 kg/m.     07/10/2023    2:34 PM 07/04/2022   11:16 AM 06/28/2021    2:28 PM 09/08/2020    1:04 PM 06/23/2020    1:09 PM 07/14/2013   10:34 AM  Advanced Directives  Does Patient Have a Medical Advance Directive? No No No No Yes Patient has advance directive, copy not in chart   Type of Advance Directive     Living will Living will   Does patient want to make changes to medical advance directive?     No - Patient declined   Would patient like information on creating a medical advance directive? No - Patient declined No - Patient declined No - Patient declined No - Patient declined       Data saved with a previous flowsheet row definition     Current Medications (verified) Outpatient Encounter Medications as of 07/10/2023  Medication Sig   aspirin EC 81 MG tablet Take 81 mg by  mouth daily. Swallow whole.   lisinopril  (ZESTRIL ) 2.5 MG tablet Take 1 tablet (2.5 mg total) by mouth daily.   Magnesium 400 MG CAPS Take 400 mg by mouth daily.   Omega-3 Fatty Acids (FISH OIL) 1000 MG CAPS Take 1,200 mg by mouth daily at 6 (six) AM.   pravastatin  (PRAVACHOL ) 40 MG tablet Take 1 tablet (40 mg total) by mouth daily.   No facility-administered encounter medications on file as of 07/10/2023.    Allergies (verified) Patient has no known allergies.   History: Past Medical History:  Diagnosis Date   AMI (acute myocardial infarction) (HCC) 1980   Aortic regurgitation    Cardiomegaly    Colon cancer (HCC) 1990's   Detached retina    Hyperlipidemia    Hypertension    Past Surgical History:  Procedure Laterality Date   EYE SURGERY     INGUINAL HERNIA REPAIR Right 09/16/2020   Procedure: OPEN REPAIR RECURRENT RIGHT INGUINAL HERNIA WITH MESH;  Surgeon: Tanda Locus, MD;  Location: [facility];  Service: General;  Laterality: Right;  TAP BLOCK   PARTIAL COLECTOMY     Family History  Problem Relation Age of Onset   Liver cancer Father        deceased   Cancer Father        liver   Cancer Brother        colon and esophageal   Alzheimer's disease Other  2 sisters   Colon cancer Other        2 brothers   Esophageal cancer Other    Social History   Socioeconomic History   Marital status: Married    Spouse name: Hadassah   Number of children: 2   Years of education: 6   Highest education level: 6th grade  Occupational History    Comment: Retired  Tobacco Use   Smoking status: Never   Smokeless tobacco: Never  Vaping Use   Vaping status: Never Used  Substance and Sexual Activity   Alcohol use: Yes    Comment: Rare   Drug use: No   Sexual activity: Not on file  Other Topics Concern   Not on file  Social History Narrative   Lives with wife and son. He enjoys yardwork, watching soccer and news on TV.   Social Drivers of Corporate investment banker Strain:  Low Risk  (07/10/2023)   Overall Financial Resource Strain (CARDIA)    Difficulty of Paying Living Expenses: Not hard at all  Food Insecurity: No Food Insecurity (07/10/2023)   Hunger Vital Sign    Worried About Running Out of Food in the Last Year: Never true    Ran Out of Food in the Last Year: Never true  Transportation Needs: No Transportation Needs (07/10/2023)   PRAPARE - Administrator, Civil Service (Medical): No    Lack of Transportation (Non-Medical): No  Physical Activity: Sufficiently Active (07/10/2023)   Exercise Vital Sign    Days of Exercise per Week: 5 days    Minutes of Exercise per Session: 40 min  Stress: No Stress Concern Present (07/10/2023)   [employer] of Occupational Health - Occupational Stress Questionnaire    Feeling of Stress: Only a little  Social Connections: Moderately Isolated (07/10/2023)   Social Connection and Isolation Panel    Frequency of Communication with Friends and Family: More than three times a week    Frequency of Social Gatherings with Friends and Family: More than three times a week    Attends Religious Services: Never    Database administrator or Organizations: No    Attends Engineer, structural: Never    Marital Status: Married    Tobacco Counseling Counseling given: Not Answered   Clinical Intake:  Pre-visit preparation completed: Yes  Pain : No/denies pain     BMI - recorded: 19.91 Nutritional Status: BMI of 19-24  Normal Nutritional Risks: None Diabetes: No  How often do you need to have someone help you when you read instructions, pamphlets, or other written materials from your doctor or pharmacy?: 4 - Often What is the last grade level you completed in school?: 6  Interpreter Needed?: Yes Interpreter Agency: His son Patient Declined Interpreter : No      Activities of Daily Living    07/10/2023    2:24 PM  In your present state of health, do you have any difficulty performing the  following activities:  Hearing? 0  Vision? 1  Difficulty concentrating or making decisions? 0  Walking or climbing stairs? 0  Dressing or bathing? 0  Doing errands, shopping? 0  Preparing Food and eating ? N  Using the Toilet? N  In the past six months, have you accidently leaked urine? N  Do you have problems with loss of bowel control? N  Managing your Medications? N  Managing your Finances? N  Housekeeping or managing your Housekeeping? N    Patient  Care Team: Breeback, Jade L, PA-C as PCP - General (Family Medicine) Pietro Redell RAMAN, MD as PCP - Cardiology (Cardiology)  Indicate any recent Medical Services you may have received from other than [facility] providers in the past year (date may be approximate).     Assessment:   This is a routine wellness examination for [facility].  Hearing/Vision screen No results found.   Goals Addressed             This Visit's Progress    Patient Stated       Patient states he would like sleep better.        Depression Screen    07/10/2023    2:33 PM 09/22/2022    1:54 PM 07/04/2022   11:17 AM 05/05/2022    1:54 PM 11/02/2021   11:27 AM 06/28/2021    2:30 PM 05/03/2021   10:56 AM  PHQ 2/9 Scores  PHQ - 2 Score 0 0 1 0 2 0 2  PHQ- 9 Score     7 0 8    Fall Risk    07/10/2023    2:34 PM 11/17/2022    4:47 PM 09/22/2022    1:54 PM 07/04/2022   11:16 AM 05/05/2022    1:53 PM  Fall Risk   Falls in the past year? 1 1 0 0 0  Number falls in past yr: 0 1 0 0 0  Injury with Fall? 1 1 0 0 0  Risk for fall due to : Impaired mobility History of fall(s);Impaired balance/gait;Impaired mobility;Impaired vision No Fall Risks No Fall Risks No Fall Risks  Follow up Falls evaluation completed Education provided;Falls evaluation completed;Falls prevention discussed Falls evaluation completed Falls evaluation completed Falls evaluation completed    MEDICARE RISK AT HOME: Medicare Risk at Home Any stairs in or around the home?: Yes If so, are there  any without handrails?: Yes Home free of loose throw rugs in walkways, pet beds, electrical cords, etc?: Yes Adequate lighting in your home to reduce risk of falls?: Yes Life alert?: No Use of a cane, walker or w/c?: No Grab bars in the bathroom?: No Shower chair or bench in shower?: No Elevated toilet seat or a handicapped toilet?: Yes  TIMED UP AND GO:  Was the test performed?  No    Cognitive Function:        07/10/2023    2:38 PM 07/04/2022   11:19 AM 06/28/2021    2:31 PM 06/23/2020    1:16 PM 11/14/2016   11:52 AM  6CIT Screen  What Year? 0 points 0 points 0 points 0 points 0 points  What month? 0 points 0 points 0 points 0 points 0 points  What time? 0 points 0 points 0 points 0 points 0 points  Count back from 20 0 points 0 points 0 points 2 points 0 points  Months in reverse 2 points 4 points 4 points 4 points 2 points  Repeat phrase 6 points 6 points 10 points 10 points 2 points  Total Score 8 points 10 points 14 points 16 points 4 points    Immunizations Immunization History  Administered Date(s) Administered   Fluad Quad(high Dose 65+) 11/02/2020, 09/22/2022   Influenza,inj,Quad PF,6+ Mos 12/25/2012, 11/03/2014, 11/14/2016, 11/02/2021   Janssen (J&J) SARS-COV-2 Vaccination 03/28/2019   PFIZER(Purple Top)SARS-COV-2 Vaccination 01/01/2020, 07/01/2020   Pfizer(Comirnaty)Fall Seasonal Vaccine 12 years and older 11/02/2021, 03/26/2023   Pneumococcal Conjugate-13 11/14/2016   Pneumococcal Polysaccharide-23 03/17/2010   Td 03/17/2010   Tdap  09/26/2022   Zoster, Live 12/25/2012    TDAP status: Up to date  Flu Vaccine status: Up to date  Pneumococcal vaccine status: Up to date  Covid-19 vaccine status: Completed vaccines  Qualifies for Shingles Vaccine? Yes   Zostavax completed Yes   Shingrix Completed?: No.    Education has been provided regarding the importance of this vaccine. Patient has been advised to call insurance company to determine out of pocket  expense if they have not yet received this vaccine. Advised may also receive vaccine at local pharmacy or Health Dept. Verbalized acceptance and understanding.  Screening Tests Health Maintenance  Topic Date Due   Zoster Vaccines- Shingrix (1 of 2) 05/02/1952   INFLUENZA VACCINE  08/17/2023   COVID-19 Vaccine (6 - 2024-25 season) 09/26/2023   Medicare Annual Wellness (AWV)  07/09/2024   DTaP/Tdap/Td (3 - Td or Tdap) 09/25/2032   Pneumococcal Vaccine: 50+ Years  Completed   Hepatitis B Vaccines  Aged Out   HPV VACCINES  Aged Out   Meningococcal B Vaccine  Aged Out    Health Maintenance  Health Maintenance Due  Topic Date Due   Zoster Vaccines- Shingrix (1 of 2) [birth year]02/1952    Colorectal cancer screening: No longer required.   Lung Cancer Screening: (Low Dose CT Chest recommended if Age 57-80 years, 20 pack-year currently smoking OR have quit w/in 15years.) does not qualify.   Lung Cancer Screening Referral: n/a  Additional Screening:  Hepatitis C Screening: does not qualify; Completed   Vision Screening: Recommend annual ophthalmology exams for early detection of glaucoma and other disorders of the eye. Is the patient up to date with their annual eye exam?  No  Who is the provider or what is the name of the office in which the patient attends annual eye exams?  If pt is not established with a provider, would they like to be referred to a provider to establish care? No .   Dental Screening: Recommended annual dental exams for proper oral hygiene   Community Resource Referral / Chronic Care Management: CRR required this visit?  No   CCM required this visit?  No     Plan:     I have personally reviewed and noted the following in the patient's chart:   Medical and social history Use of alcohol, tobacco or illicit drugs  Current medications and supplements including opioid prescriptions. Patient is not currently taking opioid prescriptions. Functional ability and  status Nutritional status Physical activity Advanced directives List of other physicians Hospitalizations, surgeries, and ER # 1 visits in previous 12 months Vitals Screenings to include cognitive, depression, and falls Referrals and appointments  In addition, I have reviewed and discussed with patient certain preventive protocols, quality metrics, and best practice recommendations. A written personalized care plan for preventive services as well as general preventive health recommendations were provided to patient.     Bonny Jon Mayor, CMA   07/10/2023   After Visit Summary: (Mail) Due to this being a telephonic visit, the after visit summary with patients personalized plan was offered to patient via mail   Nurse Notes:   Revel Stellmach is a 89 y.o. male patient of Antoniette Vermell CROME, PA-C who had a [employer] Visit today via telephone. Zacchary is Retired and lives with their family. he has 2 children. He reports that he is socially active and does interact with friends/family regularly. He is minimally physically active and enjoys watching soccer and news on TV and yardwork.

## 2023-07-10 NOTE — Patient Instructions (Signed)
 Gregory Cuevas , Thank you for taking time to come for your Medicare Wellness Visit. I appreciate your ongoing commitment to your health goals. Please review the following plan we discussed and let me know if I can assist you in the future.   These are the goals we discussed:  Goals       Patient Stated (pt-stated)      06/23/2020 AWV Goal: Improved Nutrition/Diet  Patient will verbalize understanding that diet plays an important role in overall health and that a poor diet is a risk factor for many chronic medical conditions.  Over the next year, patient will improve self management of their diet by incorporating more water. Patient will utilize available community resources to help with food acquisition if needed (ex: food pantries, [address], etc) Patient will work with nutrition specialist if a referral was made       Patient Stated (pt-stated)      06/28/2021 AWV Goal: Exercise for General Health  Patient will verbalize understanding of the benefits of increased physical activity: Exercising regularly is important. It will improve your overall fitness, flexibility, and endurance. Regular exercise also will improve your overall health. It can help you control your weight, reduce stress, and improve your bone density. Over the next year, patient will increase physical activity as tolerated with a goal of at least 150 minutes of moderate physical activity per week.  You can tell that you are exercising at a moderate intensity if your heart starts beating faster and you start breathing faster but can still hold a conversation. Moderate-intensity exercise ideas include: Walking 1 mile (1.6 km) in about 15 minutes Biking Hiking Golfing Dancing Water aerobics Patient will verbalize understanding of everyday activities that increase physical activity by providing examples like the following: Yard work, such as: Company secretary Gardening Washing windows or floors Patient will be able to explain general safety guidelines for exercising:  Before you start a new exercise program, talk with your health care provider. Do not exercise so much that you hurt yourself, feel dizzy, or get very short of breath. Wear comfortable clothes and wear shoes with good support. Drink plenty of water while you exercise to prevent dehydration or heat stroke. Work out until your breathing and your heartbeat get faster.       Patient Stated (pt-stated)      07/04/2022 AWV Goal: Exercise for General Health  Patient will verbalize understanding of the benefits of increased physical activity: Exercising regularly is important. It will improve your overall fitness, flexibility, and endurance. Regular exercise also will improve your overall health. It can help you control your weight, reduce stress, and improve your bone density. Over the next year, patient will increase physical activity as tolerated with a goal of at least 150 minutes of moderate physical activity per week.  You can tell that you are exercising at a moderate intensity if your heart starts beating faster and you start breathing faster but can still hold a conversation. Moderate-intensity exercise ideas include: Walking 1 mile (1.6 km) in about 15 minutes Biking Hiking Golfing Dancing Water aerobics Patient will verbalize understanding of everyday activities that increase physical activity by providing examples like the following: Yard work, such as: Insurance underwriter Gardening Washing windows or floors Patient will be able to explain general safety guidelines for exercising:  Before you start a new exercise program, talk with your health care provider. Do not exercise so much that you hurt yourself, feel dizzy, or get very short of breath. Wear comfortable  clothes and wear shoes with good support. Drink plenty of water while you exercise to prevent dehydration or heat stroke. Work out until your breathing and your heartbeat get faster.       Patient Stated      Patient states he would like sleep better.         This is a list of the screening recommended for you and due dates:  Health Maintenance  Topic Date Due   Zoster (Shingles) Vaccine (1 of 2) 05/02/1954   Flu Shot  08/17/2023   COVID-19 Vaccine (6 - 2024-25 season) 09/26/2023   Medicare Annual Wellness Visit  07/09/2024   DTaP/Tdap/Td vaccine (3 - Td or Tdap) 09/25/2032   Pneumococcal Vaccine for age over 62  Completed   Hepatitis B Vaccine  Aged Out   HPV Vaccine  Aged Out   Meningitis B Vaccine  Aged Out

## 2023-09-06 ENCOUNTER — Telehealth: Payer: Self-pay

## 2023-09-06 DIAGNOSIS — E7800 Pure hypercholesterolemia, unspecified: Secondary | ICD-10-CM

## 2023-09-06 NOTE — Telephone Encounter (Signed)
 Patients son came into office to get refill on the following medications :  pravastatin  (PRAVACHOL ) 40 MG tablet to pharmacy [id number] - [place], [place] - [address]

## 2023-09-11 MED ORDER — PRAVASTATIN SODIUM 40 MG PO TABS
40.0000 mg | ORAL_TABLET | Freq: Every day | ORAL | 3 refills | Status: AC
Start: 2023-09-11 — End: ?

## 2023-09-26 ENCOUNTER — Ambulatory Visit (INDEPENDENT_AMBULATORY_CARE_PROVIDER_SITE_OTHER): Admitting: Physician Assistant

## 2023-09-26 VITALS — BP 140/60 | HR 59 | Ht 64.0 in | Wt 117.0 lb

## 2023-09-26 DIAGNOSIS — E7800 Pure hypercholesterolemia, unspecified: Secondary | ICD-10-CM

## 2023-09-26 DIAGNOSIS — I10 Essential (primary) hypertension: Secondary | ICD-10-CM

## 2023-09-26 DIAGNOSIS — I351 Nonrheumatic aortic (valve) insufficiency: Secondary | ICD-10-CM | POA: Diagnosis not present

## 2023-09-26 DIAGNOSIS — B351 Tinea unguium: Secondary | ICD-10-CM | POA: Insufficient documentation

## 2023-09-26 DIAGNOSIS — Z23 Encounter for immunization: Secondary | ICD-10-CM | POA: Diagnosis not present

## 2023-09-26 MED ORDER — AMBULATORY NON FORMULARY MEDICATION: 0 refills | Status: AC

## 2023-09-26 MED ORDER — LISINOPRIL 2.5 MG PO TABS: 2.5000 mg | ORAL_TABLET | Freq: Every day | ORAL | 1 refills | Status: AC

## 2023-09-26 NOTE — Progress Notes (Unsigned)
   Established Patient Office Visit  Subjective   Patient ID: Gregory Cuevas, male    DOB: 1933-12-06  Age: 89 y.o. MRN: [id number]  Chief Complaint  Patient presents with   Medical Management of Chronic Issues    HPI Pt is a 89 yo male HTN, AR, AV stenosis, HLD who presents to the clinic with his son for medication refills.   Pt is doing well today. He is not checking his BP at home. He is staying active and riding a stationary bike. He denies any CP, palpitations, headaches or vision changes.   He would like to know what to do about his thick toenail.    Review of Systems  All other systems reviewed and are negative.     Objective:     BP (!) 140/60 (BP Location: Right Arm, Patient Position: Sitting, Cuff Size: Normal)   Pulse (!) 59   Ht 5' 4 (1.626 m)   Wt 117 lb (53.1 kg)   SpO2 99%   BMI 20.08 kg/m  BP Readings from Last 3 Encounters:  09/26/23 (!) 140/60  03/26/23 (!) 145/71  01/26/23 (!) 149/56   Wt Readings from Last 3 Encounters:  09/26/23 117 lb (53.1 kg)  07/10/23 116 lb (52.6 kg)  03/26/23 116 lb 8 oz (52.8 kg)      Physical Exam Constitutional:      Appearance: Normal appearance.  Cardiovascular:     Rate and Rhythm: Normal rate and regular rhythm.     Heart sounds: Murmur heard.  Pulmonary:     Breath sounds: Normal breath sounds.  Musculoskeletal:     Right lower leg: No edema.     Left lower leg: No edema.  Skin:    Comments: Right great toenail is thick, yellow, dystrophic.   Neurological:     Mental Status: He is alert and oriented to person, place, and time.          Assessment & Plan:  SABRASABRASalvator was seen today for medical management of chronic issues.  Diagnoses and all orders for this visit:  Essential hypertension, benign -     CMP14+EGFR -     CBC w/Diff/Platelet -     lisinopril  (ZESTRIL ) 2.5 MG tablet; Take 1 tablet (2.5 mg total) by mouth daily.  Pure hypercholesterolemia -     Lipid panel -     CBC  w/Diff/Platelet  Onychomycosis -     Ambulatory referral to Podiatry  Mild aortic regurgitation  Immunization due -     AMBULATORY NON FORMULARY MEDICATION; COVID Vaccine 2025 DDx: Over 65yo. HTN. Aortic Regurgitation. -     Flu vaccine HIGH DOSE PF(Fluzone Trivalent)   Refilled lisinopril  for BP Flu shot given today Covid vaccine prescription printed Fasting labs ordered today Referral to podiatry to help with cutting toenails and treating toenail fungus   Return in about 7 months (around 04/25/2024).    Kemond Amorin, PA-C

## 2023-09-26 NOTE — Patient Instructions (Addendum)
 Get covid vaccine at pharmacy.  Flu shot given today.  Referral for podiatry downstairs to help with toenail cutting and fungus treatment.

## 2023-09-27 ENCOUNTER — Encounter: Payer: Self-pay | Admitting: Physician Assistant

## 2023-09-27 LAB — CBC WITH DIFFERENTIAL/PLATELET
Basophils Absolute: 0 x10E3/uL (ref 0.0–0.2)
Basos: 1 %
EOS (ABSOLUTE): 0.1 x10E3/uL (ref 0.0–0.4)
Eos: 2 %
Hematocrit: 42.4 % (ref 37.5–51.0)
Hemoglobin: 13.2 g/dL (ref 13.0–17.7)
Immature Grans (Abs): 0 x10E3/uL (ref 0.0–0.1)
Immature Granulocytes: 0 %
Lymphocytes Absolute: 2.6 x10E3/uL (ref 0.7–3.1)
Lymphs: 53 %
MCH: 27 pg (ref 26.6–33.0)
MCHC: 31.1 g/dL — ABNORMAL LOW (ref 31.5–35.7)
MCV: 87 fL (ref 79–97)
Monocytes Absolute: 0.6 x10E3/uL (ref 0.1–0.9)
Monocytes: 13 %
Neutrophils Absolute: 1.5 x10E3/uL (ref 1.4–7.0)
Neutrophils: 31 %
Platelets: 229 x10E3/uL (ref 150–450)
RBC: 4.89 x10E6/uL (ref 4.14–5.80)
RDW: 13.1 % (ref 11.6–15.4)
WBC: 4.9 x10E3/uL (ref 3.4–10.8)

## 2023-09-27 LAB — CMP14+EGFR
ALT: 10 IU/L (ref 0–44)
AST: 19 IU/L (ref 0–40)
Albumin: 3.9 g/dL (ref 3.7–4.7)
Alkaline Phosphatase: 84 IU/L (ref 44–121)
BUN/Creatinine Ratio: 22 (ref 10–24)
BUN: 23 mg/dL (ref 8–27)
Bilirubin Total: 0.5 mg/dL (ref 0.0–1.2)
CO2: 23 mmol/L (ref 20–29)
Calcium: 9.5 mg/dL (ref 8.6–10.2)
Chloride: 102 mmol/L (ref 96–106)
Creatinine, Ser: 1.03 mg/dL (ref 0.76–1.27)
Globulin, Total: 2.5 g/dL (ref 1.5–4.5)
Glucose: 82 mg/dL (ref 70–99)
Potassium: 4.8 mmol/L (ref 3.5–5.2)
Sodium: 137 mmol/L (ref 134–144)
Total Protein: 6.4 g/dL (ref 6.0–8.5)
eGFR: 70 mL/min/1.73 (ref >59)

## 2023-09-27 LAB — LIPID PANEL
Chol/HDL Ratio: 2.4 ratio (ref 0.0–5.0)
Cholesterol, Total: 212 mg/dL — ABNORMAL HIGH (ref 100–199)
HDL: 87 mg/dL (ref >39)
LDL Chol Calc (NIH): 109 mg/dL — ABNORMAL HIGH (ref 0–99)
Triglycerides: 91 mg/dL (ref 0–149)
VLDL Cholesterol Cal: 16 mg/dL (ref 5–40)

## 2023-09-28 ENCOUNTER — Ambulatory Visit: Payer: Self-pay | Admitting: Physician Assistant

## 2023-09-28 NOTE — Progress Notes (Signed)
 Mancel,   Kidney/Liver/Sugars look good.  Cholesterol up a little from 1 year ago. Continue on pravachol  for now. Work on decreasing processed/fried/fatty foods in diet.

## 2023-11-01 ENCOUNTER — Encounter: Payer: Self-pay | Admitting: Podiatry

## 2023-11-01 ENCOUNTER — Ambulatory Visit: Admitting: Podiatry

## 2023-11-01 DIAGNOSIS — B351 Tinea unguium: Secondary | ICD-10-CM | POA: Diagnosis not present

## 2023-11-01 NOTE — Addendum Note (Signed)
 Addended by: WAYLAN ELIDIA PARAS on: 11/01/2023 05:06 PM   Modules accepted: Orders

## 2023-11-01 NOTE — Progress Notes (Signed)
  Subjective:  Patient ID: Gregory Cuevas, male    DOB: [birth year],   MRN: [id number]  Chief Complaint  Patient presents with   Nail Problem    My nail, I've tried everything. (Hallux right)    89 y.o. male presents for concern of right hallux nail discoloration and thickeness. Relates it has been present for about 8 months. He states he believes he damaged the nail. He states he has tried many treatments. Soaks and topicals including tea tree oil. It has improved some but still not looking great . Denies any other pedal complaints. Denies n/v/f/c.   Past Medical History:  Diagnosis Date   AMI (acute myocardial infarction) (HCC) 1980   Aortic regurgitation    Cardiomegaly    Colon cancer (HCC) 1990's   Detached retina    Hyperlipidemia    Hypertension     Objective:  Physical Exam: Vascular: DP/PT pulses 2/4 bilateral. CFT <3 seconds. Normal hair growth on digits. No edema.  Skin. No lacerations or abrasions bilateral feet. Right hallux nail thickened and dystrophic with subungual debris.  Musculoskeletal: MMT 5/5 bilateral lower extremities in DF, PF, Inversion and Eversion. Deceased ROM in DF of ankle joint.  Neurological: Sensation intact to light touch.   Assessment:   1. Onychomycosis      Plan:  Patient was evaluated and treated and all questions answered. -Examined patient -Discussed treatment options for painful dystrophic nails  -Clinical picture and Fungal culture was obtained by removing a portion of the hard nail itself from each of the involved toenails using a sterile nail nipper and sent to [facility] lab. Patient tolerated the biopsy procedure well without discomfort or need for anesthesia.  -Discussed fungal nail treatment options including oral, topical, and laser treatments.  -Patient to return in 4 weeks for follow up evaluation and discussion of fungal culture results or sooner if symptoms worsen.   Asberry Failing, DPM

## 2023-11-08 ENCOUNTER — Other Ambulatory Visit: Payer: Self-pay | Admitting: Podiatry

## 2023-12-06 ENCOUNTER — Ambulatory Visit: Admitting: Podiatry

## 2023-12-06 ENCOUNTER — Encounter: Payer: Self-pay | Admitting: Podiatry

## 2023-12-06 DIAGNOSIS — B351 Tinea unguium: Secondary | ICD-10-CM

## 2023-12-06 MED ORDER — TERBINAFINE HCL 250 MG PO TABS: 250.0000 mg | ORAL_TABLET | Freq: Every day | ORAL | 0 refills | Status: AC

## 2023-12-06 NOTE — Progress Notes (Signed)
  Subjective:  Patient ID: Gregory Cuevas, male    DOB: 11/22/1933,   MRN: [id number]  Chief Complaint  Patient presents with   Nail Problem    I've noticed that it has improved.    89 y.o. male presents for  follow-up of fungal nails and to discuss culture results. . Denies any other pedal complaints. Denies n/v/f/c.   Past Medical History:  Diagnosis Date   AMI (acute myocardial infarction) (HCC) 1980   Aortic regurgitation    Cardiomegaly    Colon cancer (HCC) 1990's   Detached retina    Hyperlipidemia    Hypertension     Objective:  Physical Exam: Vascular: DP/PT pulses 2/4 bilateral. CFT <3 seconds. Normal hair growth on digits. No edema.  Skin. No lacerations or abrasions bilateral feet. Right hallux nail thickened and dystrophic with subungual debris.  Musculoskeletal: MMT 5/5 bilateral lower extremities in DF, PF, Inversion and Eversion. Deceased ROM in DF of ankle joint.  Neurological: Sensation intact to light touch.   Assessment:   1. Onychomycosis       Plan:  Patient was evaluated and treated and all questions answered. -Examined patient -Discussed treatment options for painful dystrophic nails  - Culture positive for fungus.  Epidermophyton floccosum.  -Discussed fungal nail treatment options including oral, topical, and laser treatments. -Previous LFTS wnl.   -Lamisil sent to pharmacy x90 days  -Patient to return in 3 months for recheck   Asberry Failing, DPM

## 2024-01-25 ENCOUNTER — Encounter: Payer: Self-pay | Admitting: Physician Assistant

## 2024-03-06 ENCOUNTER — Ambulatory Visit: Admitting: Podiatry

## 2024-04-25 ENCOUNTER — Ambulatory Visit: Admitting: Physician Assistant

## 2024-07-10 ENCOUNTER — Ambulatory Visit
